# Patient Record
Sex: Male | Born: 1950 | Race: White | Hispanic: No | Marital: Married | Smoking: Former smoker
Health system: Southern US, Community
[De-identification: ages and names within clinical notes are randomized; demographics above are authoritative.]

## PROBLEM LIST (undated history)

## (undated) DIAGNOSIS — K219 Gastro-esophageal reflux disease without esophagitis: Secondary | ICD-10-CM

## (undated) DIAGNOSIS — R569 Unspecified convulsions: Secondary | ICD-10-CM

## (undated) DIAGNOSIS — H269 Unspecified cataract: Secondary | ICD-10-CM

## (undated) DIAGNOSIS — F419 Anxiety disorder, unspecified: Secondary | ICD-10-CM

## (undated) DIAGNOSIS — M1990 Unspecified osteoarthritis, unspecified site: Secondary | ICD-10-CM

## (undated) DIAGNOSIS — Z9889 Other specified postprocedural states: Secondary | ICD-10-CM

## (undated) DIAGNOSIS — D649 Anemia, unspecified: Secondary | ICD-10-CM

## (undated) DIAGNOSIS — R7303 Prediabetes: Secondary | ICD-10-CM

## (undated) DIAGNOSIS — Z8619 Personal history of other infectious and parasitic diseases: Secondary | ICD-10-CM

## (undated) DIAGNOSIS — N390 Urinary tract infection, site not specified: Secondary | ICD-10-CM

## (undated) DIAGNOSIS — T8859XA Other complications of anesthesia, initial encounter: Secondary | ICD-10-CM

## (undated) DIAGNOSIS — K5790 Diverticulosis of intestine, part unspecified, without perforation or abscess without bleeding: Secondary | ICD-10-CM

## (undated) DIAGNOSIS — H409 Unspecified glaucoma: Secondary | ICD-10-CM

## (undated) DIAGNOSIS — K2270 Barrett's esophagus without dysplasia: Secondary | ICD-10-CM

## (undated) DIAGNOSIS — G8929 Other chronic pain: Secondary | ICD-10-CM

## (undated) DIAGNOSIS — M1711 Unilateral primary osteoarthritis, right knee: Secondary | ICD-10-CM

## (undated) DIAGNOSIS — F329 Major depressive disorder, single episode, unspecified: Secondary | ICD-10-CM

## (undated) DIAGNOSIS — R51 Headache: Secondary | ICD-10-CM

## (undated) DIAGNOSIS — F32A Depression, unspecified: Secondary | ICD-10-CM

## (undated) DIAGNOSIS — E7800 Pure hypercholesterolemia, unspecified: Secondary | ICD-10-CM

## (undated) DIAGNOSIS — T7840XA Allergy, unspecified, initial encounter: Secondary | ICD-10-CM

## (undated) DIAGNOSIS — N3010 Interstitial cystitis (chronic) without hematuria: Secondary | ICD-10-CM

## (undated) DIAGNOSIS — K635 Polyp of colon: Secondary | ICD-10-CM

## (undated) DIAGNOSIS — R112 Nausea with vomiting, unspecified: Secondary | ICD-10-CM

## (undated) DIAGNOSIS — G4730 Sleep apnea, unspecified: Secondary | ICD-10-CM

## (undated) DIAGNOSIS — T4145XA Adverse effect of unspecified anesthetic, initial encounter: Secondary | ICD-10-CM

## (undated) DIAGNOSIS — I10 Essential (primary) hypertension: Secondary | ICD-10-CM

## (undated) HISTORY — PX: CARDIAC CATHETERIZATION: SHX172

## (undated) HISTORY — DX: Diverticulosis of intestine, part unspecified, without perforation or abscess without bleeding: K57.90

## (undated) HISTORY — DX: Unspecified convulsions: R56.9

## (undated) HISTORY — DX: Essential (primary) hypertension: I10

## (undated) HISTORY — PX: APPENDECTOMY: SHX54

## (undated) HISTORY — DX: Headache: R51

## (undated) HISTORY — PX: EYE SURGERY: SHX253

## (undated) HISTORY — DX: Anemia, unspecified: D64.9

## (undated) HISTORY — DX: Polyp of colon: K63.5

## (undated) HISTORY — DX: Major depressive disorder, single episode, unspecified: F32.9

## (undated) HISTORY — PX: SINOSCOPY: SHX187

## (undated) HISTORY — DX: Barrett's esophagus without dysplasia: K22.70

## (undated) HISTORY — PX: HEMORROIDECTOMY: SUR656

## (undated) HISTORY — DX: Unspecified osteoarthritis, unspecified site: M19.90

## (undated) HISTORY — PX: OTHER SURGICAL HISTORY: SHX169

## (undated) HISTORY — DX: Depression, unspecified: F32.A

## (undated) HISTORY — DX: Allergy, unspecified, initial encounter: T78.40XA

## (undated) HISTORY — DX: Pure hypercholesterolemia, unspecified: E78.00

## (undated) HISTORY — PX: KNEE SURGERY: SHX244

## (undated) HISTORY — PX: DG KNEE LEFT COMPLETE (ARMC HX): HXRAD1556

## (undated) HISTORY — PX: TONSILLECTOMY: SUR1361

## (undated) HISTORY — DX: Unspecified cataract: H26.9

## (undated) HISTORY — DX: Other chronic pain: G89.29

## (undated) HISTORY — PX: ANKLE SURGERY: SHX546

## (undated) HISTORY — DX: Urinary tract infection, site not specified: N39.0

## (undated) HISTORY — DX: Interstitial cystitis (chronic) without hematuria: N30.10

## (undated) HISTORY — DX: Personal history of other infectious and parasitic diseases: Z86.19

## (undated) HISTORY — DX: Sleep apnea, unspecified: G47.30

## (undated) HISTORY — DX: Unspecified glaucoma: H40.9

## (undated) HISTORY — DX: Gastro-esophageal reflux disease without esophagitis: K21.9

## (undated) HISTORY — DX: Anxiety disorder, unspecified: F41.9

## (undated) HISTORY — PX: JOINT REPLACEMENT: SHX530

---

## 2004-01-27 ENCOUNTER — Ambulatory Visit (HOSPITAL_COMMUNITY): Admission: RE | Admit: 2004-01-27 | Discharge: 2004-01-27 | Payer: Self-pay | Admitting: Specialist

## 2005-04-25 ENCOUNTER — Encounter
Admission: RE | Admit: 2005-04-25 | Discharge: 2005-07-24 | Payer: Self-pay | Admitting: Physical Medicine & Rehabilitation

## 2005-04-25 ENCOUNTER — Ambulatory Visit: Payer: Self-pay | Admitting: Physical Medicine & Rehabilitation

## 2005-09-24 HISTORY — PX: OTHER SURGICAL HISTORY: SHX169

## 2005-10-24 ENCOUNTER — Encounter: Admission: RE | Admit: 2005-10-24 | Discharge: 2005-10-24 | Payer: Self-pay | Admitting: Orthopaedic Surgery

## 2006-01-10 ENCOUNTER — Inpatient Hospital Stay (HOSPITAL_COMMUNITY): Admission: RE | Admit: 2006-01-10 | Discharge: 2006-01-12 | Payer: Self-pay | Admitting: Orthopaedic Surgery

## 2006-09-24 HISTORY — PX: CERVICAL FUSION: SHX112

## 2006-10-15 ENCOUNTER — Encounter: Admission: RE | Admit: 2006-10-15 | Discharge: 2006-10-15 | Payer: Self-pay | Admitting: Orthopaedic Surgery

## 2007-02-13 ENCOUNTER — Ambulatory Visit (HOSPITAL_COMMUNITY): Admission: RE | Admit: 2007-02-13 | Discharge: 2007-02-14 | Payer: Self-pay | Admitting: Orthopaedic Surgery

## 2008-06-25 ENCOUNTER — Encounter: Payer: Self-pay | Admitting: Gastroenterology

## 2009-03-29 ENCOUNTER — Encounter: Admission: RE | Admit: 2009-03-29 | Discharge: 2009-03-29 | Payer: Self-pay | Admitting: Orthopaedic Surgery

## 2009-07-04 ENCOUNTER — Ambulatory Visit: Payer: Self-pay | Admitting: Gastroenterology

## 2009-09-13 ENCOUNTER — Telehealth: Payer: Self-pay | Admitting: Gastroenterology

## 2010-05-08 ENCOUNTER — Encounter: Payer: Self-pay | Admitting: Gastroenterology

## 2010-05-10 ENCOUNTER — Telehealth: Payer: Self-pay | Admitting: Gastroenterology

## 2010-07-10 ENCOUNTER — Encounter: Admission: RE | Admit: 2010-07-10 | Discharge: 2010-07-10 | Payer: Self-pay | Admitting: Orthopaedic Surgery

## 2010-10-01 ENCOUNTER — Encounter: Payer: Self-pay | Admitting: Gastroenterology

## 2010-10-02 ENCOUNTER — Telehealth: Payer: Self-pay | Admitting: Gastroenterology

## 2010-10-10 ENCOUNTER — Ambulatory Visit
Admission: RE | Admit: 2010-10-10 | Discharge: 2010-10-10 | Payer: Self-pay | Source: Home / Self Care | Attending: Gastroenterology | Admitting: Gastroenterology

## 2010-10-10 ENCOUNTER — Encounter: Payer: Self-pay | Admitting: Gastroenterology

## 2010-10-10 DIAGNOSIS — R197 Diarrhea, unspecified: Secondary | ICD-10-CM | POA: Insufficient documentation

## 2010-10-10 DIAGNOSIS — K219 Gastro-esophageal reflux disease without esophagitis: Secondary | ICD-10-CM | POA: Insufficient documentation

## 2010-10-24 NOTE — Procedures (Signed)
Summary: Recall Assessment/[facility]  Recall Assessment/ GI   Imported By: Sherian Rein 05/24/2010 11:19:13  _____________________________________________________________________  External Attachment:    Type:   Image     Comment:   External Document

## 2010-10-24 NOTE — Progress Notes (Signed)
Summary: ECL?  Phone Note Call from Patient Call back at [phone]   Caller: Patient Call For: Dr. Christella Hartigan Reason for Call: Talk to Nurse Summary of Call: EGD and COL recall dates are a few months apart... pt wants to know if he can have ECL Initial call taken by: Vallarie Mare,  May 10, 2010 2:01 PM  Follow-up for Phone Call        Dr Christella Hartigan the pt has recall for EGD Oct 2011 and Colon for Feb 2012.  Can he have these together in Feb. ?  I advised the pt Dr Christella Hartigan is out of the office until next week. Follow-up by: Chales Abrahams CMA Duncan Dull),  May 10, 2010 2:07 PM  Additional Follow-up for Phone Call Additional follow up Details #1::        that is ok to have ecl in february Additional Follow-up by: Rachael Fee MD,  May 11, 2010 5:11 PM    Additional Follow-up for Phone Call Additional follow up Details #2::    left message on machine to call back Chales Abrahams CMA Duncan Dull)  May 15, 2010 9:03 AM   pt returned call we will call him the end of Jan to schedule.  Flag in the system. Follow-up by: Chales Abrahams CMA Duncan Dull),  May 15, 2010 10:05 AM   Appended Document: ECL?    Clinical Lists Changes  Observations: Added new observation of EGD DUE: 10/2010 (05/15/2010 10:10) Added new observation of COLONNXTDUE: 10/2010 (05/15/2010 10:10)

## 2010-10-26 NOTE — Letter (Signed)
Summary: [name] Instructions  [name]  [address] [name], [place] [address]   Phone: [phone]  Fax: [phone]       Brad Fischer    Jun 30, 1951    MRN: [id number]        Procedure Day Dorna Bloom:Wednesday February 29th, 2012     Arrival Time: 2:30pm     Procedure Time: 3:30pm     Location of Procedure:                    _ x_  [name] (4th Floor)                        PREPARATION FOR COLONOSCOPY WITH MOVIPREP   Starting 5 days prior to your procedure2/24/12 do not eat nuts, seeds, popcorn, corn, beans, peas,  salads, or any raw vegetables.  Do not take any fiber supplements (e.g. Metamucil, Citrucel, and Benefiber).  THE DAY BEFORE YOUR PROCEDURE         DATE: 11/21/10  DAY: Tuesday  1.  Drink clear liquids the entire day-NO SOLID FOOD  2.  Do not drink anything colored red or purple.  Avoid juices with pulp.  No orange juice.  3.  Drink at least 64 oz. (8 glasses) of fluid/clear liquids during the day to prevent dehydration and help the prep work efficiently.  CLEAR LIQUIDS INCLUDE: Water Jello Ice Popsicles Tea (sugar ok, no milk/cream) Powdered fruit flavored drinks Coffee (sugar ok, no milk/cream) Gatorade Juice: apple, white grape, white cranberry  Lemonade Clear bullion, consomm, broth Carbonated beverages (any kind) Strained chicken noodle soup Hard Candy                             4.  In the morning, mix first dose of MoviPrep solution:    Empty 1 Pouch A and 1 Pouch B into the disposable container    Add lukewarm drinking water to the top line of the container. Mix to dissolve    Refrigerate (mixed solution should be used within 24 hrs)  5.  Begin drinking the prep at 5:00 p.m. The MoviPrep container is divided by 4 marks.   Every 15 minutes drink the solution down to the next mark (approximately 8 oz) until the full liter is complete.   6.  Follow completed prep with 16 oz of clear liquid of your choice  (Nothing red or purple).  Continue to drink clear liquids until bedtime.  7.  Before going to bed, mix second dose of MoviPrep solution:    Empty 1 Pouch A and 1 Pouch B into the disposable container    Add lukewarm drinking water to the top line of the container. Mix to dissolve    Refrigerate  THE DAY OF YOUR PROCEDURE      DATE: 11/22/10 DAY: Wednesday  Beginning at 10:30 a.m. (5 hours before procedure):         1. Every 15 minutes, drink the solution down to the next mark (approx 8 oz) until the full liter is complete.  2. Follow completed prep with 16 oz. of clear liquid of your choice.    3. You may drink clear liquids until 1:30pm (2 HOURS BEFORE PROCEDURE).   MEDICATION INSTRUCTIONS  Unless otherwise instructed, you should take regular prescription medications with a small sip of water   as early as possible the morning of your procedure.  OTHER INSTRUCTIONS  You will need a responsible adult at least 59 years of age to accompany you and drive you home.   This person must remain in the waiting room during your procedure.  Wear loose fitting clothing that is easily removed.  Leave jewelry and other valuables at home.  However, you may wish to bring a book to read or  an iPod/MP3 player to listen to music as you wait for your procedure to start.  Remove all body piercing jewelry and leave at home.  Total time from sign-in until discharge is approximately 2-3 hours.  You should go home directly after your procedure and rest.  You can resume normal activities the  day after your procedure.  The day of your procedure you should not:   Drive   Make legal decisions   Operate machinery   Drink alcohol   Return to work  You will receive specific instructions about eating, activities and medications before you leave.    The above instructions have been reviewed and explained to me by   Estill Bamberg.     I fully understand and can verbalize these  instructions _____________________________ Date _________

## 2010-10-26 NOTE — Assessment & Plan Note (Signed)
Review of gastrointestinal problems: 1. Barrett's esophagus: EGD reports from 2009, 2008, 2007 and several predating that. The 2007 and 2008 EGDs document irregular Z line, pathology proved Barrett's mucosa without dysplasia (Dr. Charmayne Sheer) The 2009 EGD also documents irregular Z line however biopsies did not show Barrett's change. 2. Colon polyps: He had a colonoscopy in 2007 and this showed diverticulosis without polyps. Pt is very clear that a previous colonoscopy in 1990s showed precancerous polyps    History of Present Illness Visit Type: Follow-up Visit Primary GI MD: Rob Bunting MD Primary Provider: Lise Auer, MD  Requesting Provider: na Chief Complaint: Hosp f/u for diarrhea  History of Present Illness:     very pleasant 60 year old man whom I last saw several months ago.  who had some intermittent diarrhrea 1-2 weeks ago, this led up to a more signficant cramping, diarrheal illness.  No recent antibiotics.   He 20 loose stools in 1-2 days.  No nausea or vomiting or fevers. Went to local ER, in [place].    he sent in stool specimin, testing was neg for c . diff, routine cultures.  he took 3 days of cipro.  Feels 1000 times better.  No sick contacts.            Current Medications (verified): 1)  Hydrocodone-Acetaminophen 5-500 Mg Tabs (Hydrocodone-Acetaminophen) .... As Needed 2)  Budeprion Xl 150 Mg Xr24h-Tab (Bupropion Hcl) .Marland Kitchen.. 1 By Mouth Once Daily 3)  Atenolol 50 Mg Tabs (Atenolol) .... 1/2 Tablet By Mouth Once Daily 4)  Alprazolam 0.5 Mg Tabs (Alprazolam) .... As Needed 5)  Vitamin D3 1000 Unit Caps (Cholecalciferol) .... 2,000 International Units At 4am 6)  Vitamin B-12 5000 Mcg Subl (Cyanocobalamin) .Marland Kitchen.. 1 Every Am and 1 Every Pm 7)  Iodine 225 .... One Tablet By Mouth Three Times A Day 8)  Metamucil 30.9 % Powd (Psyllium) .... Uses Daily 9)  Co Q-10 30 Mg Caps (Coenzyme Q10) .Marland Kitchen.. 1 By Mouth Two Times A Day 10)  Tamsulosin Hcl 0.4 Mg Caps (Tamsulosin  Hcl) .... One Capsule By Mouth Once Daily 11)  Finasteride 5 Mg Tabs (Finasteride) .... One Tablet By Mouth Once Daily 12)  Aspirin 81 Mg  Tabs (Aspirin) .... One Tablet By Mouth Once Daily 13)  Ranitidine Hcl 75 Mg Tabs (Ranitidine Hcl) .... One Tablet By Mouth Once Daily 14)  Saw Palmetto 160 Mg Caps (Saw Palmetto (Serenoa Repens)) .... One Capsule By Mouth Two Times A Day 15)  Magnesium Glycinate 133mg  .... One Tablet By Mouth Once Daily 16)  Vitamin B-12 5000 Mcg Tablet (Cyanocobalamin) .... One Tablet By Mouth Two Times A Day 17)  Vitamin D3 10000 Unit Caps (Cholecalciferol) .... One Capsule By Mouth Once Daily 18)  Protein  Powd (Protein) .... As Directed 19)  Multivitamin & Mineral  Liqd (Multiple Vitamins-Minerals) .... As Directed  Allergies (verified): 1)  ! Bactrim 2)  ! Codeine  Vital Signs:  Patient profile:   60 year old male Height:      68 inches Weight:      176 pounds BMI:     26.86 BSA:     1.94 Pulse rate:   88 / minute Pulse rhythm:   regular BP sitting:   124 / 68  (left arm) Cuff size:   regular  Vitals Entered By: Ok Anis CMA (October 10, 2010 3:01 PM)  Physical Exam  Additional Exam:  Constitutional: generally well appearing Psychiatric: alert and oriented times 3 Abdomen: soft, non-tender, non-distended,  normal bowel sounds    Impression & Recommendations:  Problem # 1:  History of colon polyps, recent diarrheal illness I suspect his recent diarrheal illness was infectious. Likely a virus. He has completely recovered. He was due for colon polyp surveillance examinations and we will schedule that at his soonest convenience.  Problem # 2:  GERD, history of Barrett's change he is due for surveillance EGD and we will set that up at the same time as his colonoscopy. I recommended he restart proton pump inhibitors since H2 blockers did not seem to be helping his mild indigestion symptoms.  Patient Instructions: 1)  You will be scheduled to have an  upper endoscopy. 2)  You will be scheduled to have a colonoscopy. 3)  Stop the ranitidine, start OTC prilosec or prevacid (or the generic equivalent).  One pill once daily. 4)  A copy of this information will be sent to Dr. Welton Flakes. 5)  The medication list was reviewed and reconciled.  All changed / newly prescribed medications were explained.  A complete medication list was provided to the patient / caregiver.  Appended Document: Orders Update    Clinical Lists Changes  Problems: Added new problem of DIARRHEA (ICD-787.91) Added new problem of GERD (ICD-530.81) Medications: Added new medication of MOVIPREP 100 GM  SOLR (PEG-KCL-NACL-NASULF-NA ASC-C) As per prep instructions. - Signed Rx of MOVIPREP 100 GM  SOLR (PEG-KCL-NACL-NASULF-NA ASC-C) As per prep instructions.;  #1 x 0;  Signed;  Entered by: Christie Nottingham CMA (AAMA);  Authorized by: Rachael Fee MD;  Method used: Electronically to [facility] [address], [place], [place], [place]  [address], Ph: [phone] or [phone], Fax: [phone] Orders: Added new Test order of Colon/Endo (Colon/Endo) - Signed    Prescriptions: MOVIPREP 100 GM  SOLR (PEG-KCL-NACL-NASULF-NA ASC-C) As per prep instructions.  #1 x 0   Entered by:   Christie Nottingham CMA (AAMA)   Authorized by:   Rachael Fee MD   Signed by:   Christie Nottingham CMA (AAMA) on 10/10/2010   Method used:   Electronically to        [facility] (retail)       [address]       [place], [place]  [address]       Ph: [phone] or [phone]       Fax: [phone]   RxID:   [id number]

## 2010-10-26 NOTE — Progress Notes (Signed)
Summary: triage  Phone Note Call from Patient Call back at Home Phone [phone]   Caller: Vilinda Flake Call For: Dr Christella Hartigan Reason for Call: Talk to Nurse Summary of Call: Patient has severe diarrhea x 10 days and was taken to the emergency room yesterday and was told to f.u with his gi doc, but patient can't wait until first available appt 2-8 Initial call taken by: Tawni Levy,  October 02, 2010 1:16 PM  Follow-up for Phone Call        pt scheduled to see Dr Christella Hartigan on 10/10/10.  Pt aware Follow-up by: Chales Abrahams CMA (AAMA),  October 02, 2010 1:28 PM

## 2010-11-22 ENCOUNTER — Other Ambulatory Visit: Payer: Self-pay | Admitting: Gastroenterology

## 2010-11-22 ENCOUNTER — Encounter (AMBULATORY_SURGERY_CENTER): Payer: Medicare Other | Admitting: Gastroenterology

## 2010-11-22 DIAGNOSIS — Z1211 Encounter for screening for malignant neoplasm of colon: Secondary | ICD-10-CM

## 2010-11-22 DIAGNOSIS — K5730 Diverticulosis of large intestine without perforation or abscess without bleeding: Secondary | ICD-10-CM

## 2010-11-22 DIAGNOSIS — K2270 Barrett's esophagus without dysplasia: Secondary | ICD-10-CM

## 2010-11-22 DIAGNOSIS — Z86010 Personal history of colonic polyps: Secondary | ICD-10-CM

## 2010-11-28 ENCOUNTER — Encounter: Payer: Self-pay | Admitting: Gastroenterology

## 2010-11-30 NOTE — Procedures (Signed)
Summary: Colonoscopy  Patient: [name] Note: All result statuses are Final unless otherwise noted.  Tests: (1) Colonoscopy (COL)   COL Colonoscopy           DONE     [facility]     [address]     [place], [place]  [address]           COLONOSCOPY PROCEDURE REPORT           PATIENT:  Brad Fischer, Brad Fischer  MR#:  [id number]     BIRTHDATE:  [birth year], 59 yrs. old  GENDER:  male     ENDOSCOPIST:  Rachael Fee, MD     PROCEDURE DATE:  11/22/2010     PROCEDURE:  Diagnostic Colonoscopy     ASA CLASS:  Class II     INDICATIONS:  history of pre-cancerous (adenomatous) colon polyps,     Dr. Virginia Rochester     MEDICATIONS:   Fentanyl 100 mcg IV, Versed 10 mg IV, Benadryl 25     mg IV           DESCRIPTION OF PROCEDURE:   After the risks benefits and     alternatives of the procedure were thoroughly explained, informed     consent was obtained.  Digital rectal exam was performed and     revealed no rectal masses.   The LB [id number]-H180AL [id number] endoscope     was introduced through the anus and advanced to the cecum, which     was identified by both the appendix and ileocecal valve, without     limitations.  The quality of the prep was good, using MoviPrep.     The instrument was then slowly withdrawn as the colon was fully     examined.     <<PROCEDUREIMAGES>>     FINDINGS:  Moderate diverticulosis was found in the sigmoid to     descending colon segments (see image4).  Internal and external     Hemorrhoids were found.  This was otherwise a normal examination     of the colon (see image2, image3, and image6).   Retroflexed views     in the rectum revealed no abnormalities.    The scope was then     withdrawn from the patient and the procedure completed.     COMPLICATIONS:  None           ENDOSCOPIC IMPRESSION:     1) Moderate diverticulosis in the sigmoid to descending colon     segments     2) Internal and external hemorrhoids     3) Otherwise normal examination  RECOMMENDATIONS:     1) Given your personal history of adenomatous (pre-cancerous)     polyps, you will need a repeat colonoscopy in 5 years.           REPEAT EXAM:  5 years           ______________________________     Rachael Fee, MD           cc: Amada Kingfisher, MD           n.     Rosalie Doctor:   Rachael Fee at 11/22/2010 03:26 PM           [name], [id number]  Note: An exclamation mark (!) indicates a result that was not dispersed into the flowsheet. Document Creation Date: 11/22/2010 3:26 PM _______________________________________________________________________  (1) Order result status: Final Collection or observation date-time: 11/22/2010 15:21 Requested date-time:  Receipt date-time:  Reported date-time:  Referring Physician:   Ordering Physician: Rob Bunting [phone]) Specimen Source:  Source: Launa Grill Order Number: [id number] Lab site:   Appended Document: Colonoscopy    Clinical Lists Changes  Observations: Added new observation of COLONNXTDUE: 10/2015 (11/22/2010 15:55)

## 2010-11-30 NOTE — Procedures (Addendum)
Summary: Upper Endoscopy  Patient: [name] Note: All result statuses are Final unless otherwise noted.  Tests: (1) Upper Endoscopy (EGD)   EGD Upper Endoscopy       DONE     [facility]     [address]     [place], [place]  [address]          ENDOSCOPY PROCEDURE REPORT          PATIENT:  Brad, Fischer  MR#:  [id number]     BIRTHDATE:  20-Jan-1951, 59 yrs. old  GENDER:  male     ENDOSCOPIST:  Rachael Fee, MD     PROCEDURE DATE:  11/22/2010     PROCEDURE:  EGD with biopsy, 432[address]     ASA CLASS:  Class II     INDICATIONS:  h/o Barrett's Esophagus          MEDICATIONS:  Versed 3 mg IV     TOPICAL ANESTHETIC:  Exactacain Spray          DESCRIPTION OF PROCEDURE:   After the risks benefits and     alternatives of the procedure were thoroughly explained, informed     consent was obtained.  The [facility] GIF-H180 [id number] endoscope was     introduced through the mouth and advanced to the second portion of     the duodenum, without limitations.  The instrument was slowly     withdrawn as the mucosa was fully examined.     <<PROCEDUREIMAGES>>     There was mild, non-specific gastritis (see image2).  A hiatal     hernia was found. This was 2cm (see image1).  There was an     irregular Z-line (see image5), this was biopsied and sent to     pathology (jar 1).  Otherwise the examination was normal (see     image3 and image4).    Retroflexed views revealed no     abnormalities.    The scope was then withdrawn from the patient     and the procedure completed.     COMPLICATIONS:  None          ENDOSCOPIC IMPRESSION:     1) Mild gastritis     2) Hiatal hernia     3) Irregular Z-line, previously shown to have Barrett's change     without dysplasia.  Repeat biopsies taken today.     4) Otherwise normal examination          RECOMMENDATIONS:     Await final biopsies.     Stay on PPI once daily.          ______________________________     Rachael Fee, MD         n.     eSIGNED:   Rachael Fee at 11/22/2010 03:49 PM          [name], [id number]  Note: An exclamation mark (!) indicates a result that was not dispersed into the flowsheet. Document Creation Date: 11/22/2010 3:49 PM _______________________________________________________________________  (1) Order result status: Final Collection or observation date-time: 11/22/2010 15:32 Requested date-time:  Receipt date-time:  Reported date-time:  Referring Physician:   Ordering Physician: Rob Bunting [phone]) Specimen Source:  Source: Launa Grill Order Number: [id number] Lab site:   Appended Document: Upper Endoscopy     Procedures Next Due Date:    EGD: 10/2013

## 2010-12-05 NOTE — Letter (Addendum)
Summary: Results Letter  [facility]  [address] [place], [place] [address]   Phone: [phone]  Fax: [phone]        November 28, 2010 MRN: [id number]    Brad Fischer [address] [place], [place]  [address]    Dear [name],   The biopsies during your recent procedure showed Barrett's mucosa, but NO sign of the pre-cancerous change called "dysplasia."   Therefore, unless new symptoms arise you will not need another upper endoscopy for 3 years.  We will therefore put your information in our reminder system and will contact you in 3 years to schedule a repeat procedure.  Please call with any questions or concerns.       Sincerely,  Rachael Fee MD  This letter has been electronically signed by your physician.  Appended Document: Results Letter letter mailed

## 2011-02-06 NOTE — Op Note (Signed)
NAME:  Brad Fischer, Brad Fischer NO.:  [id number]   MEDICAL RECORD NO.:  [id number]          PATIENT TYPE:  INP   LOCATION:  5020                         FACILITY:  [facility]   PHYSICIAN:  Sharolyn Douglas, M.D.        DATE OF BIRTH:  1951-01-23   DATE OF PROCEDURE:  02/13/2007  DATE OF DISCHARGE:                               OPERATIVE REPORT   DIAGNOSIS:  Cervical spondylotic radiculopathy.   PROCEDURES:  1. Anterior cervical diskectomy, C3-4 and C4-5.  2. Anterior cervical arthrodesis, C3-4 and C4-5, with placement of two      PEEK cages, one at each level.  3. Anterior cervical plating, C3 through C5, using the Abbott spine      system.  4. Left anterior iliac crest bone graft.   SURGEON:  Sharolyn Douglas, MD.   ASSISTANT:  Aura Fey. Bobbe Medico.   ANESTHESIA:  General endotracheal.   COMPLICATIONS:  None.   Needle and sponge count correct.   INDICATIONS:  The patient is a pleasant 60 year old male with persistent  neck and left upper extremity pain.  His imaging studies show  degenerative changes most advanced C3-4, C4-5.  MRI scan shows foraminal  narrowing at both levels.  I had originally discussed with him  performing an ACDF at C4-5.  After careful review of his MRI scan again  preoperatively, it was evident that he had significant foraminal  narrowing at C3-4 as well, worse on the left side, and because most of  his symptomatology was in the distribution of the C3-4, C4-5 levels, I  elected to perform the surgery from C3 to C5.  Risks, benefits and  alternatives were reviewed with the patient before going to the  operating room.   PROCEDURE:  After informed consent he was taken to the OR.  He was given  prophylactic IV antibiotics.  He underwent general endotracheal  anesthesia without difficulty.  He was positioned carefully with the  [place] head rest.  The neck was placed inside extension.  Five pounds  of halter traction applied.  The neck was prepped and draped  in the  usual sterile fashion including the left anterior iliac crest.  We then  made a small a left-sided incision at the level of the thyroid  cartilage.  Dissection was carried through this transverse incision and  through the platysma.  The interval then developed between the SCM and  strap muscles medially down to the prevertebral space.  A blunt  dissection was completed to expose the anterior cervical spine.  The  esophagus, trachea and carotid sheath were identified and protected at  all times.  A spinal needle was placed at C5-6 and intraoperative x-ray  was taken to confirm this level.  We then worked up exposing the C3-4  and C4-5 levels.  A deep shadow line retractor was placed.  Caspar  distraction pins placed in the C3, C4, C5 vertebral bodies.  Gentle  distraction was applied.  The microscope was brought into the field.  The remainder of the operation was done under microscopic illumination  and magnification.  Starting at C3-4, a radical diskectomy carried back  to the posterior longitudinal ligament.  A high-speed bur used to take  down the uncovertebral joints, posterior vertebral margins.  We then  completed wide foraminotomies bilaterally.  The cartilaginous endplates  were scraped clean in preparation for the arthrodesis.   We turned our attention to obtaining anterior iliac crest bone graft  from the left hip.  A small 1 cm incision was made of the left ASIS.  Dissection was carried sharply through the deep fascia.  A trephine was  used to obtain several bone cores.  This wound was then irrigated.  Hemostasis was achieved.  It was closed in layers using 2-0 Vicryl and 3-  0 Vicryl.  We then used Dermabond on the skin edges.   We turned our attention back to the cervical spine.  A 5-mm PEEK cage  was packed tightly with the anterior iliac crest bone graft.  This cage  was then inserted into the C3-4 disk space and countersunk 1 mm.  We  then performed a similar  procedure at C4-5 again with a radical  diskectomy.  We scraped the cartilaginous endplates.  We used a high-  speed bur to take down the uncovertebral joints.  A Kerrison punch was  used to complete foraminotomies bilaterally.  At this level we also used  a 5-mm PEEK cage which was packed with the bone graft obtained the left  iliac crest.  The PEEK cage was countersunk 1 mm.  We then placed  anterior cervical plate from C3 to C5 with six 12-mm locking screws.  We  ensured the locking mechanism engaged.  The screw purchase was adequate.  The wound was irrigated.  Hemostasis was achieved.  The esophagus,  trachea, and carotid sheath were examined.  There were no apparent  injuries.  A deep TLS drain was left in place.  Platysma closed with  interrupted 2-0 Vicryl suture.  Subcutaneous layer closed with 2-0  Vicryl and 3-0 Vicryl, followed by a running 4-0 subcuticular suture on  the skin edges.  Dermabond was applied.  A soft cervical collar placed.  The patient was extubated without difficulty, transferred to recovery in  stable condition neurologically intact.   It should be noted that my assistant, Aura Fey. Bobbe Medico., was present  throughout the procedure.  She assisted with positioning.  She assisted  with the exposure, using the suction and helping with the retractors.  She then worked under the microscope in the using the suction and  protecting the soft tissues.  She helped using the drill while I held  the drill guide to place the cervical plate.  Finally, she helped with  the iliac crest bone graft by providing traction, and she helped with  wound closure.      Sharolyn Douglas, M.D.  Electronically Signed     MC/MEDQ  D:  02/13/2007  T:  02/14/2007  Job:  [id number]

## 2011-02-09 NOTE — H&P (Signed)
NAME:  Brad Fischer, Brad Fischer NO.:  [id number]   MEDICAL RECORD NO.:  [id number]          PATIENT TYPE:  INP   LOCATION:  NA                           FACILITY:  [facility]   PHYSICIAN:  Sharolyn Douglas, M.D.        DATE OF BIRTH:  1951/05/29   DATE OF ADMISSION:  DATE OF DISCHARGE:                                HISTORY & PHYSICAL   DATE OF ADMISSION:  January 10, 2006   CHIEF COMPLAINT:  Low back pain and bilateral lower extremity pain, left  greater than right.   HISTORY OF PRESENT ILLNESS:  The patient is a 60 year old male who has had a  longstanding history of problems with his back radiating into his lower  extremities.  He has tried numerous types of conservative management, all  unfortunately without any lasting relief of his symptoms.  It was thought  his best course of management at this point would be a posterior spinal  fusion procedure and decompression.  Risks and benefits of this procedure  were discussed with the patient by Dr. Noel Gerold as well as myself.  He  indicated understanding and opted to proceed.   ALLERGIES:  BACTRIM and DARVON cause headaches.   MEDICATIONS:  1.  Relpax 40 mg p.o. p.r.n. migraines.  2.  Hydrocodone 10/325 one to two p.o. daily p.r.n. pain.  3.  Mirapex 0.125 mg p.r.n. for restless leg syndrome.  4.  Simvastatin 40 mg p.o. daily which he stopped 3 days ago.  5.  Nexium 40 mg p.o. daily which he stopped 3 days ago.  6.  Trazodone 100 mg p.o. q.h.s. p.r.n.  7.  Atenolol 50 mg p.o. b.i.d.  8.  Paroxetine HCl 20 mg p.o. daily.   PAST MEDICAL HISTORY:  1.  Migraines.  2.  Anxiety.  3.  Hypertension.  4.  History of ulcers.  5.  BPH..  6.  Constipation.  7.  Degenerative disc disease.   PAST SURGICAL HISTORY:  None on his spine.   SOCIAL HISTORY:  The patient is married.  He quit smoking 7 years ago. He  denies alcohol use.  His wife will be available to help him through his  postoperative course.   FAMILY HISTORY:   Noncontributory.   REVIEW OF SYSTEMS:  GENERAL:  The patient denies any fevers, chills, sweats,  bleeding tendencies.  CNS:  Denies blurred vision, double vision, seizures,  headache, paralysis.  CARDIOVASCULAR:  Denies chest pain, angina, orthopnea,  claudication, or palpitations.  PULMONARY:  Denies shortness of breath,  productive cough, or hemoptysis.  GI:  Denies nausea, vomiting,  constipation, diarrhea, melena, or bloody stool.  GU:  Denies dysuria,  hematuria, or discharge.  MUSCULOSKELETAL:  As per HPI.   PHYSICAL EXAMINATION:  VITAL SIGNS:  Blood pressure is 126/72, respirations  are 16 and unlabored, pulse is 60 and regular.  GENERAL APPEARANCE:  The patient is a 60 year old white male who is alert  and oriented in no acute distress who is well-nourished, well-groomed,  appears his age, pleasant and cooperative to exam.  HEENT:  Head is normocephalic, atraumatic.  Pupils are equal, round,  reactive to light and accommodation.  NECK:  Soft to palpation.  No lymphadenopathy, thyromegaly, or bruits  appreciated.  CHEST:  Clear to auscultation bilaterally.  No rales, rhonchi, stridor,  wheezes, or friction rubs.  BREASTS:  Not pertinent, not performed.  HEART:  Regular rate and rhythm.  No murmurs, gallops, thrills, or rubs  noted.  S1, S2.  ABDOMEN:  Soft to palpation.  Nontender, nondistended.  No organomegaly  noted.  Positive bowel sounds throughout.  GENITOURINARY:  Not pertinent, not performed.  EXTREMITIES:  Per HPI.  SKIN:  Intact without any lesions or rashes.   X-ray shows degenerative changes at L4-5.  Discogram showed concordant pain  at this level with negative controls.   IMPRESSION:  1.  L4-5 degenerative disc disease and discogenic pain.  2.  Hypertension.  3.  Migraines.  4.  History of ulcer.  5.  Benign prostatic hypertrophy.   PLAN:  1.  Admit to [facility] on January 10, 2006, for an L4-5 posterior      spinal fusion.  This will be done by  Dr. Sharolyn Douglas.  2.  We are awaiting his preoperative clearance from his primary care doctor,      Dr. Welton Flakes.  Apparently, he is having a stress test done tomorrow.  Dr.      Welton Flakes will forward [place] the results and let us know if he can proceed with      his surgery on Thursday.      Verlin Fester, P.A.      Sharolyn Douglas, M.D.  Electronically Signed    CM/MEDQ  D:  01/07/2006  T:  01/07/2006  Job:  [id number]

## 2011-02-09 NOTE — Procedures (Signed)
NAME:  Brad Fischer, Brad Fischer NO.:  [id number]   MEDICAL RECORD NO.:  [id number]          PATIENT TYPE:  REC   LOCATION:  TPC                          FACILITY:  [facility]   PHYSICIAN:  Erick Colace, M.D.DATE OF BIRTH:  [birth year]   DATE OF PROCEDURE:  05/14/2005  DATE OF DISCHARGE:                                 OPERATIVE REPORT   PROCEDURE:  Acupuncture treatment #3 out of four scheduled.   Previous treatment done approximately one week ago.  Pain averaging 4/10.  He has both pain in hip, left knee, left upper trap, and pain in bilateral  feet.  He states that the last acupuncture treatment gave some relief with  foot pain x3 days.   He is ambulating 10-15 minutes, climbs steps and driving.   INTERVAL HISTORY:  MRI of hips demonstrated AVN.   He is following up with orthopedics regarding this.   Treatment today consisted of bilateral LV3, bilateral SP6, bilateral LV8,  bilateral TH8, bilateral GB34, as well as GB24.5 and DU20.   Electrical stimulation between DU20 and GB24.5, as well as between LV8 and  SP6 bilaterally, 4 hertz x 20 minutes.  The patient tolerated the procedure  well, has one more appointment for acupuncture treatment and then will  decide whether to continue treatments based on improvement.      Erick Colace, M.D.  Electronically Signed     AEK/MEDQ  D:  05/14/2005 15:26:55  T:  05/15/2005 16:10:96  Job:  [id number]

## 2011-02-09 NOTE — Procedures (Signed)
NAME:  Brad Fischer, Brad Fischer NO.:  [id number]   MEDICAL RECORD NO.:  [id number]          PATIENT TYPE:  REC   LOCATION:  TPC                          FACILITY:  [facility]   PHYSICIAN:  Erick Colace, M.D.DATE OF BIRTH:  Jul 11, 1951   DATE OF PROCEDURE:  05/10/2005  DATE OF DISCHARGE:                                 OPERATIVE REPORT   MEDICAL RECORD NUMBER:  [id number].   TREATMENT:  Acupuncture treatment #2. The patient states that his back pain  was better with the first treatment, i.e. Mingmen. However, he had some  increased knee pain relief using BL40.   Treatment today, overall pain level 5/10.   Treatment Mingmen technique was used with bilateral BL23, bilateral BL52,  and DU4. In addition, left BL40 and the four eyes of the knee, i.e. ST34,  ST 35, and LE4, LE3 were used. The patient tolerated the procedure well.  Treatment time 45 minutes. Electrical stimulation was used at 2 hertz.  Return in four days.      Erick Colace, M.D.  Electronically Signed     AEK/MEDQ  D:  05/10/2005 13:20:56  T:  05/10/2005 14:55:46  Job:  [id number]

## 2011-02-09 NOTE — Procedures (Signed)
NAME:  TESLA, BOCHICCHIO NO.:  [id number]   MEDICAL RECORD NO.:  [id number]          PATIENT TYPE:  REC   LOCATION:  TPC                          FACILITY:  [facility]   PHYSICIAN:  Erick Colace, M.D.DATE OF BIRTH:  Aug 28, 1951   DATE OF PROCEDURE:  05/07/2005  DATE OF DISCHARGE:                                 OPERATIVE REPORT   Acupuncture treatment #1 of four scheduled.  Response to previous trial  treatment, three to four-day relief of back pain with mingmen treatment.   The patient was placed prone.  Needles placed at bilateral KI3, bilateral  KI7 and bilateral BL40 as well as bilateral BL24, BL25, with electrical  stimulation between KI3, KI7 as well as in between bilateral BL24 and BL25  at 2 hertz stimulation x25 minutes.  The patient tolerated the procedure  well.  Postprocedure instructions given, and he is to return later this  week.      Erick Colace, M.D.  Electronically Signed     AEK/MEDQ  D:  05/07/2005 16:34:03  T:  05/08/2005 06:21:55  Job:  [id number]

## 2011-02-09 NOTE — Group Therapy Note (Signed)
ACUPUNCTURE CONSULTATION   REASON FOR CONSULTATION:  Question acupuncture for neck, back, and knee  pain.   HISTORY:  The patient is a 60 year old male who has had multiple pain  complaints involving neck, low back, and left knee.  His neck pain is of  four to five years' duration.  It is progressive in nature.  He states that  he has had imaging studies demonstrating three bulging disks in his neck and  that he wishes to avoid surgery for this.  He at times has left shoulder  pain that accompanies this and describes the pain as sharp and aching at  times.  In addition, he has had low back pain since the year 2000 when he  was splitting firewood.  His back wasout for 10 to 12 weeks and since that  time it has been in and out more than two times a year where he would have  exacerbations of pain.  More recently over the last year or two, he has had  accompanying left lower extremity pain mainly lateral and going down into  the left ankle area, but not into the foot.  In addition, he has had left  knee pain and has had multiple arthroscopic surgeries on his left knee. He  has been told that he has no cartilage left in his left knee.   He similarly wishes to avoid total knee replacement surgery.   PAST MEDICAL HISTORY:  1.  High blood pressure.  2.  Hiatal hernia.  3.  Hemorrhoids.   PAST SURGICAL HISTORY:  1.  Right ankle surgery.  2.  Left knee surgery in 1986, 1987, 1990, and 1992.  3.  Hemorrhoidectomy in 1998.   SOCIAL HISTORY:  Married.  Lives with his wife, Arline Asp.  Smokes half a pack a  day.   FAMILY HISTORY:  Heart disease, lung disease, high blood pressure,  psychiatric problems, alcohol abuse, and disability.   REVIEW OF SYSTEMS:  Positive for depression.  Has been taking Paxil for a  number of years.  Also with anxiety.  Numbness and tingling in his left foot  and weakness in his left knee.  He has some bladder urgency.   PHYSICAL EXAMINATION:  VITAL SIGNS:  Blood  pressure 103/76, pulse 46,  respirations 16, O2 saturation 97% on room air.  GENERAL:  Current pain level is 8/10 currently, but averaging 6.  NECK:  Some tenderness to palpation of the left upper trapezius.  He has no  evidence of shoulder girdle wasting.  EXTREMITIES:  He has full strength in bilateral upper and lower extremities.  Normal sensation in bilateral upper and lower extremities.  Normal range of  motion in bilateral upper and lower extremities with the exception of left  knee which lacks approximately 30 degrees of end-stage flexion.  He does  have some lateral knee pain with flexion. He does have crepitus in the left  knee greater than right knee.  He has pain with Apley test on the left knee.  BACK:  He has approximately 75% forward flexion and extension.  He has no  tenderness to palpation of the lumbar spine.  NEUROLOGIC:  Deep tendon reflexes are normal in bilateral upper and lower  extremities.   IMPRESSION:  1.  Chronic low back pain secondary to lumbar degenerative disk disease.  2.  Chronic neck pain secondary to cervical degenerative disk disease.  3.  Left knee osteoarthritis.   PLAN:  I have discussed with him that he has  multiple areas and it would be  difficult to treat each area every visit.  Instead, would like to focus on  certain areas.  The patient states his back pain is bothering him the most  at this point, but in addition has some concerns regarding left knee.  For  our treatment we will focus on the low back.  I did indicate that there are  some research studies showing decreased surgical rates for knee  osteoarthritis given a certain acupuncture protocol and he will conserve  this on subsequent visits.   ADDENDUM  Treatment today consisted of bilateral BL23, bilateral BL52, and DU4 with  electrical stimulation between the BL23, BL52 on each side at 2 hertz x20  minutes.  The patient tolerated the procedure well.  Post procedure  instructions  given.  We will schedule for acupuncture treatments weekly x4.  May need to get him in the right lateral decubitus position to treat the low  back as well as knee.      Erick Colace, M.D.  Electronically Signed     AEK/MedQ  D:  04/27/2005 10:32:43  T:  04/27/2005 12:04:32  Job #:  [id number]   cc:   Caralyn Guile. Ethelene Hal, M.D.  [address]  [place]  [place] [address]  Fax: [phone]

## 2011-02-09 NOTE — Procedures (Signed)
NAME:  Brad Fischer, Brad Fischer NO.:  [id number]   MEDICAL RECORD NO.:  [id number]          PATIENT TYPE:  REC   LOCATION:  TPC                          FACILITY:  [facility]   PHYSICIAN:  Erick Colace, M.D.DATE OF BIRTH:  1950-12-30   DATE OF PROCEDURE:  05/17/2005  DATE OF DISCHARGE:                                 OPERATIVE REPORT   MEDICAL RECORD NUMBER:  [id number].   His average pain now is 4/10, and his pain interference scores are similarly  4/10. Pretreatment scores were 6 to 8/10 with pain interference between 7 to  9/10. He has had pain in the knee and low back area.   Treatment today consisted of bilateral BL23, bilateral BL52, and DU4,  combined with bilateral BL25. Electrical stimulation between crossed needles  was 23, 52, and BL25. In addition, points ST34, ST10, ST9 and GB34 were  treated on the left with electrical stimulation between the points. Four  hertz for 25 minutes. The patient tolerated the procedure well. Post  procedure instructions given.      Erick Colace, M.D.  Electronically Signed     AEK/MEDQ  D:  05/17/2005 17:11:55  T:  05/18/2005 12:06:38  Job:  [id number]   cc:   Caralyn Guile. Ethelene Hal, M.D.  [address]  [place]  [place] [address]  Fax: [phone]

## 2011-02-09 NOTE — Procedures (Signed)
NAME:  Brad Fischer, Brad Fischer NO.:  [id number]   MEDICAL RECORD NO.:  [id number]          PATIENT TYPE:  REC   LOCATION:  TPC                          FACILITY:  [facility]   PHYSICIAN:  Erick Colace, M.D.DATE OF BIRTH:  04/30/1951   DATE OF PROCEDURE:  05/14/2005  DATE OF DISCHARGE:                                 OPERATIVE REPORT   PROCEDURE:  Acupuncture treatment #4.   Needles placed at DU4, BL52, BL23 bilaterally, as well as the four eyes of  the knee on the left side as well as left BL40.  Electrical stimulation  between the four knee points as well as crossed needles at BL52 and BL23  bilaterally, 4 hertz, 25 minutes.  The patient tolerated the procedure well.  Pain level 4/10 current.  I will see him back later this week.      Erick Colace, M.D.  Electronically Signed     AEK/MEDQ  D:  05/14/2005 15:28:35  T:  05/15/2005 06:30:42  Job:  [id number]   cc:   Caralyn Guile. Ethelene Hal, M.D.  [address]  [place]  [place] [address]  Fax: [phone]

## 2011-02-09 NOTE — Op Note (Signed)
NAME:  Brad Fischer, Brad Fischer NO.:  [id number]   MEDICAL RECORD NO.:  [id number]          PATIENT TYPE:  INP   LOCATION:  2899                         FACILITY:  [facility]   PHYSICIAN:  Sharolyn Douglas, M.D.        DATE OF BIRTH:  10-29-1950   DATE OF PROCEDURE:  01/10/2006  DATE OF DISCHARGE:                                 OPERATIVE REPORT   DIAGNOSES:  1.  L4-5 herniated nucleus pulposus.  2.  L4-5 degenerative disk disease.  3.  Chronic back and left-greater-than-right leg pain.   PROCEDURE:  1.  L4-5 laminectomy with wide decompression of the left L4 and L5 nerve      roots.  2.  L4-5 posterior spinal fusion.  3.  Transforaminal lumbar interbody fusion, L4-5, with placement of 11-mm      PEEK prosthetic cage.  4.  Pedicle screw instrumentation, L4-5, using the Abbott Spine System.  5.  Local autogenous bone graft supplemented with bone morphogenic protein.   SURGEON:  Sharolyn Douglas, M.D.   ASSISTANT:  Verlin Fester, P.A.   ANESTHESIA:  General endotracheal.   ESTIMATED BLOOD LOSS:  100 mL.   COMPLICATIONS:  None.   INDICATIONS:  The patient is a pleasant 60 year old male with progressive  back and left-greater-than-right leg pain.  His imaging studies show a disk  herniation at L4-5 was lateralizes to the left side.  He has had a diskogram  which showed concordant pain at L4-5.  He has failed to respond to  conservative measures and at this point has elected to undergo L4-5  decompression and fusion in hopes of improving his symptoms.  Risks and  benefits were reviewed.   PROCEDURE:  The patient was identified in the holding area and taken to the  operating room where he underwent general endotracheal anesthesia without  difficulty and given prophylactic IV antibiotics.  Neuro monitoring was  established in the form of SSEPs and lower extremity EMGs.  He was carefully  turned prone onto the Wilson frame, all bony prominences padded, face and  eyes protected at all  times, back prepped and draped in the usual sterile  fashion.  A midline incision was made from L3 down to L5.  Dissection was  carried sharply through the deep fascia, subperiosteal exposure carried out  to the tips of the transverse process of L4 and L5 bilaterally.  Intraoperative x-ray was taken to confirm levels.  Deep retractors were  placed.  We then placed pedicle screws at L4 and L5 using anatomic probing  technique.  Each pedicle starting point was identified and initiated with  the pedicle awl.  The Steffee pedicle probe was then used to cannulate the  pedicles and the pedicles were palpated with a ball-tip feeler; there were  no breeches.  Each pedicle hole was intact.  We placed 6.5 x 15-mm screws at  L4 and 6.5 x 45-mm screws at L5 bilaterally.  We had good screw purchase.  Each screw was then stimulated using triggered EMGs and there were no  deleterious changes.  We then turned our attention to  completing the  laminectomy at L4-5 by removing the entire spinous process of L4 and the  midline lamina and ligamentum flavum.  Care was taken to protect the  underlying dura.  We identified the left L4 and L5 nerve roots.  We  decompressed the roots and the lateral recess and out the respective  foramen.  We then turned our attention to completing the posterior spinal  fusion by decorticating the transverse process of L4 and L5 and packing the  local bone graft from the laminectomy into the gutters.  At this point, A  TLIF was performed.  The residual facet joint on the left side at L4-5 was  osteotomized.  The exiting and transversing nerve roots were identified.  Free-running EMGs were monitored.  The disk space was entered.  We  identified a central subligamentous disk herniation which was decompressed  back into the interspace using Epstein curettes.  We then performed a  radical diskectomy using specialized TLIF curettes.  The cartilaginous  endplates were scraped clean. The  disk space was dilated up to 11 mm.  We  packed the disk space with BMP sponges from the medium INFUSE kit along with  local bone graft from the laminectomy.  An 11-mm PEEK prosthetic cage was  then packed with a BMP sponge, inserted into the interspace, carefully  tamped anteriorly and across the midline and the free-running EMGs were  stable.  We placed short-segment titanium rods into the polyaxial screw  heads and applied compression across the L4-5 segment.  The wound was  irrigated.  Gelfoam was left over the exposed epidural space.  Sponge and  needle count was correct.  The deep fascia was closed with a #1 Vicryl  suture, subcutaneous layer closed with 2-0 Vicryl, followed by a running 3-0  subcuticular Vicryl suture on the skin edges, Benzoin and Steri-Strips  placed and sterile dressing applied.  The patient was turned supine,  extubated without difficulty, and transferred to Recovery in stable  condition, able to move his upper and lower extremities.      Sharolyn Douglas, M.D.  Electronically Signed     MC/MEDQ  D:  01/10/2006  T:  01/11/2006  Job:  [id number]

## 2011-09-25 HISTORY — PX: NOSE SURGERY: SHX723

## 2011-10-09 DIAGNOSIS — R569 Unspecified convulsions: Secondary | ICD-10-CM

## 2011-10-09 HISTORY — DX: Unspecified convulsions: R56.9

## 2012-04-04 ENCOUNTER — Ambulatory Visit
Admission: RE | Admit: 2012-04-04 | Discharge: 2012-04-04 | Disposition: A | Discharge status: Home / Self Care | Payer: Medicare Other | Source: Ambulatory Visit | Attending: Orthopaedic Surgery | Admitting: Orthopaedic Surgery

## 2012-04-04 ENCOUNTER — Other Ambulatory Visit: Payer: Self-pay | Admitting: Orthopaedic Surgery

## 2012-04-04 DIAGNOSIS — M542 Cervicalgia: Secondary | ICD-10-CM

## 2012-09-29 ENCOUNTER — Telehealth: Payer: Self-pay | Admitting: Gastroenterology

## 2012-09-29 NOTE — Telephone Encounter (Signed)
Pt was given his recall dates for colonoscopy and Endoscopy he was confused and thought the colon was three years and not 5 he will call with any questions or concerns

## 2012-10-24 ENCOUNTER — Other Ambulatory Visit: Payer: Medicare HMO

## 2012-10-24 ENCOUNTER — Encounter: Payer: Self-pay | Admitting: Gastroenterology

## 2012-10-24 ENCOUNTER — Ambulatory Visit (INDEPENDENT_AMBULATORY_CARE_PROVIDER_SITE_OTHER): Payer: Medicare HMO | Admitting: Gastroenterology

## 2012-10-24 VITALS — BP 118/84 | HR 53 | Ht 68.0 in | Wt 174.0 lb

## 2012-10-24 DIAGNOSIS — R197 Diarrhea, unspecified: Secondary | ICD-10-CM

## 2012-10-24 DIAGNOSIS — R143 Flatulence: Secondary | ICD-10-CM

## 2012-10-24 DIAGNOSIS — R141 Gas pain: Secondary | ICD-10-CM

## 2012-10-24 MED ORDER — METRONIDAZOLE 250 MG PO TABS: 250.0000 mg | ORAL_TABLET | Freq: Three times a day (TID) | ORAL | Status: AC

## 2012-10-24 NOTE — Patient Instructions (Addendum)
You will have labs checked today in the basement lab.  Please head down after you check out with the front desk  (celiac panel, stool for ova and parasites, giardia). Restart solid food. Empiric trial of flagyl antibiotic, call into your pharmacy. Would try cutting off all fiber supplements pending the results the above trials, tests. Call to report on your symptoms in 10-14 days.

## 2012-10-24 NOTE — Progress Notes (Signed)
Review of gastrointestinal problems:  1. Barrett's esophagus: EGD reports from 2009, 2008, 2007 and several predating that. The 2007 and 2008 EGDs document irregular Z line, pathology proved Barrett's mucosa without dysplasia (Dr. Jennye Boroughs) The 2009 EGD also documents irregular Z line however biopsies did not show Barrett's change. 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 2. Colon polyps: He had a colonoscopy in 2007 and this showed diverticulosis without polyps. Pt is very clear that a previous colonoscopy in 1990s showed precancerous polyps.  10/2010 colonoscopy no polyps; recall at 5 years.   HPI: This is a  very pleasant 62 year old man whom I last saw a little over year ago. He is here with his wife today.  8-10 months ago excessive flatulence.  Improved over the summer.  But has come back with a vengence.   He has no abdominal pains, no nausea. He has had no significant changes in his medicines. He takes dietary fiber supplements everyday. For the past week or so he has gone on a liquid diet in order to lose some weight and has noticed an improvement in his flatulence. He has had no real changes in his bowels, no bleeding.   Had parasites in 2009; was found to have several parasites (giardia, candida, tapeworm) by Dr. Alessandra Bevels.  Was given meds and dietary recommendations.      Past Medical History  Diagnosis Date  . Barrett's esophagus   . Anemia   . Anxiety   . Arthritis   . Chronic headaches   . Colon polyps   . Depression   . Diverticulosis   . Elevated cholesterol   . Interstitial cystitis   . Sleep apnea   . UTI (lower urinary tract infection)   . History of intestinal parasite     Past Surgical History  Procedure Date  . Spinal fusion   . Knee surgery     left  . Ankle surgery     right  . Hemorroidectomy   . Neck surgery   . Nose surgery   . Eye lid surgery   . Appendectomy   . Tonsillectomy     Current  Outpatient Prescriptions  Medication Sig Dispense Refill  . ALPRAZolam (XANAX) 0.5 MG tablet Take 0.5 mg by mouth at bedtime as needed.      Marland Kitchen aspirin 81 MG tablet Take 81 mg by mouth daily.      Marland Kitchen atenolol-chlorthalidone (TENORETIC) 100-25 MG per tablet Take 1 tablet by mouth daily. Take 1/2 tablet daily      . Cholecalciferol (VITAMIN D PO) Take 1 tablet by mouth daily.      . cyclobenzaprine (FLEXERIL) 10 MG tablet Take 10 mg by mouth 3 (three) times daily as needed.      . eletriptan (RELPAX) 40 MG tablet One tablet by mouth at onset of headache. May repeat in 2 hours if headache persists or recurs. may repeat in 2 hours if necessary      . HYDROcodone-acetaminophen (NORCO/VICODIN) 5-325 MG per tablet Take 1 tablet by mouth every 6 (six) hours as needed.      . Multiple Vitamin (MULTIVITAMIN) tablet Take 1 tablet by mouth daily.      Marland Kitchen omeprazole (PRILOSEC) 20 MG capsule Take 20 mg by mouth daily.      . pregabalin (LYRICA) 50 MG capsule Take 50 mg by mouth 2 (two) times daily.      . psyllium (METAMUCIL) 58.6 % powder Take 1  packet by mouth daily.      . simethicone (MYLICON) 125 MG chewable tablet Chew 125 mg by mouth every 6 (six) hours as needed.        Allergies as of 10/24/2012 - Review Complete 10/24/2012  Allergen Reaction Noted  . Codeine    . Sulfamethoxazole w-trimethoprim      Family History  Problem Relation Age of Onset  . Colon cancer Paternal Grandfather 48  . Lung cancer Father   . Breast cancer Mother   . COPD Mother   . Heart disease Mother   . Heart disease Maternal Grandfather   . Heart disease Maternal Grandmother     History   Social History  . Marital Status: Married    Spouse Name: N/A    Number of Children: N/A  . Years of Education: N/A   Occupational History  . retired Engineer, site    Social History Main Topics  . Smoking status: Never Smoker   . Smokeless tobacco: Never Used  . Alcohol Use: No  . Drug Use: No  . Sexually Active: Not  on file   Other Topics Concern  . Not on file   Social History Narrative  . No narrative on file      Physical Exam: BP 118/84  Pulse 53  Ht 5\' 8"  (1.727 m)  Wt 174 lb (78.926 kg)  BMI 26.46 kg/m2 Constitutional: generally well-appearing Psychiatric: alert and oriented x3 Abdomen: soft, nontender, nondistended, no obvious ascites, no peritoneal signs, normal bowel sounds     Assessment and plan: 63 y.o. male with excessive flatulence without any other significant GI symptoms  this is almost always a dietary issue. Perhaps he has malabsorption from celiac sprue and I'm going to get blood testing done for that. He has also had Giardia in the past and so I am going to empirically treat him with Flagyl course of one week. She will also get stool testing done to check for Giardia. If these measures aren't helpful I would have him come off all fiber supplements since these can certainly cause excessive gas. He will call to report on his symptoms in about 2 weeks.

## 2012-10-27 LAB — CELIAC PANEL 10
Endomysial Screen: NEGATIVE
Gliadin IgA: 14.6 U/mL (ref <20)
Gliadin IgG: 26.6 U/mL — ABNORMAL HIGH (ref <20)
Tissue Transglut Ab: 19 U/mL (ref <20)
Tissue Transglutaminase Ab, IgA: 4.6 U/mL (ref <20)

## 2012-10-30 ENCOUNTER — Encounter: Payer: Self-pay | Admitting: Gastroenterology

## 2012-11-03 ENCOUNTER — Telehealth: Payer: Self-pay | Admitting: Gastroenterology

## 2012-11-03 NOTE — Telephone Encounter (Signed)
FYI:  Pt has finished abx and started solid food diet.  Gas and bloating are much better.

## 2012-11-04 NOTE — Telephone Encounter (Signed)
Great, thanks

## 2012-12-01 ENCOUNTER — Telehealth: Payer: Self-pay | Admitting: *Deleted

## 2012-12-01 ENCOUNTER — Encounter: Payer: Self-pay | Admitting: Gastroenterology

## 2012-12-01 ENCOUNTER — Ambulatory Visit (AMBULATORY_SURGERY_CENTER): Payer: Medicare HMO | Admitting: *Deleted

## 2012-12-01 VITALS — Ht 68.0 in | Wt 175.0 lb

## 2012-12-01 DIAGNOSIS — R894 Abnormal immunological findings in specimens from other organs, systems and tissues: Secondary | ICD-10-CM

## 2012-12-01 DIAGNOSIS — K3189 Other diseases of stomach and duodenum: Secondary | ICD-10-CM

## 2012-12-01 DIAGNOSIS — R1013 Epigastric pain: Secondary | ICD-10-CM

## 2012-12-01 NOTE — Telephone Encounter (Signed)
Procedure changed to propofol day.  12/12/12 at 10:30am.  Pt notified and new times reviewed with pt and his wife.

## 2012-12-01 NOTE — Telephone Encounter (Signed)
Let's change to MAC, propofol day at [facility] given his trouble with sedation in the past.  Thanks

## 2012-12-01 NOTE — Telephone Encounter (Signed)
Mr. Mathe is concerned about his coming Endo and the sedation.  He received maximum fentanyl and versed plus benadryl for his colon and an additional 3mg  versed for his Endo.  I explained that if he is changed to the deeper sedation, he'll have to be rescheduled on a day there is a Scientist, clinical (histocompatibility and immunogenetics) here.  He said he'd go with Dr. Christella Hartigan recommendation.  He is scheduled for [facility] 12/15/12 at 10:00am.

## 2012-12-01 NOTE — Progress Notes (Signed)
Mr. Lewallen is concerned about his coming Endo and the sedation.  He received maximum fentanyl and versed plus benadryl for his colon and an additional 3mg  versed for his Endo.  I explained that if he is changed to the deeper sedation, he'll have to be rescheduled on a day there is a Scientist, clinical (histocompatibility and immunogenetics) here.  He said he'd go with Dr. Christella Hartigan recommendation.

## 2012-12-12 ENCOUNTER — Ambulatory Visit (AMBULATORY_SURGERY_CENTER): Payer: Medicare HMO | Admitting: Gastroenterology

## 2012-12-12 ENCOUNTER — Encounter: Payer: Self-pay | Admitting: Gastroenterology

## 2012-12-12 VITALS — BP 122/76 | HR 49 | Temp 97.6°F | Resp 23 | Ht 68.0 in | Wt 175.0 lb

## 2012-12-12 DIAGNOSIS — K2970 Gastritis, unspecified, without bleeding: Secondary | ICD-10-CM

## 2012-12-12 DIAGNOSIS — K2990 Gastroduodenitis, unspecified, without bleeding: Secondary | ICD-10-CM

## 2012-12-12 DIAGNOSIS — K3189 Other diseases of stomach and duodenum: Secondary | ICD-10-CM

## 2012-12-12 DIAGNOSIS — K449 Diaphragmatic hernia without obstruction or gangrene: Secondary | ICD-10-CM

## 2012-12-12 DIAGNOSIS — K2940 Chronic atrophic gastritis without bleeding: Secondary | ICD-10-CM

## 2012-12-12 MED ORDER — SODIUM CHLORIDE 0.9 % IV SOLN: 500.0000 mL | INTRAVENOUS | Status: DC

## 2012-12-12 NOTE — Patient Instructions (Addendum)
YOU HAD AN ENDOSCOPIC PROCEDURE TODAY AT [facility]: Refer to the procedure report that was given to you for any specific questions about what was found during the examination.  If the procedure report does not answer your questions, please call your gastroenterologist to clarify.  If you requested that your care partner not be given the details of your procedure findings, then the procedure report has been included in a sealed envelope for you to review at your convenience later.  YOU SHOULD EXPECT: Some feelings of bloating in the abdomen. Passage of more gas than usual.  Walking can help get rid of the air that was put into your GI tract during the procedure and reduce the bloating. If you had a lower endoscopy (such as a colonoscopy or flexible sigmoidoscopy) you may notice spotting of blood in your stool or on the toilet paper. If you underwent a bowel prep for your procedure, then you may not have a normal bowel movement for a few days.  DIET: Your first meal following the procedure should be a light meal and then it is ok to progress to your normal diet.  A half-sandwich or bowl of soup is an example of a good first meal.  Heavy or fried foods are harder to digest and may make you feel nauseous or bloated.  Likewise meals heavy in dairy and vegetables can cause extra gas to form and this can also increase the bloating.  Drink plenty of fluids but you should avoid alcoholic beverages for 24 hours.  ACTIVITY: Your care partner should take you home directly after the procedure.  You should plan to take it easy, moving slowly for the rest of the day.  You can resume normal activity the day after the procedure however you should NOT DRIVE or use heavy machinery for 24 hours (because of the sedation medicines used during the test).    SYMPTOMS TO REPORT IMMEDIATELY: A gastroenterologist can be reached at any hour.  During normal business hours, 8:30 AM to 5:00 PM Monday through Friday,  call [phone].  After hours and on weekends, please call the GI answering service at [phone]  Emergency number who will take a message and have the physician on call contact you.   Following upper endoscopy (EGD)  Vomiting of blood or coffee ground material  New chest pain or pain under the shoulder blades  Painful or persistently difficult swallowing  New shortness of breath  Fever of 100F or higher  Black, tarry-looking stools  FOLLOW UP: If any biopsies were taken you will be contacted by phone or by letter within the next 1-3 weeks.  Call your gastroenterologist if you have not heard about the biopsies in 3 weeks.  Our staff will call the home number listed on your records the next business day following your procedure to check on you and address any questions or concerns that you may have at that time regarding the information given to you following your procedure. This is a courtesy call and so if there is no answer at the home number and we have not heard from you through the emergency physician on call, we will assume that you have returned to your regular daily activities without incident.  SIGNATURES/CONFIDENTIALITY: You and/or your care partner have signed paperwork which will be entered into your electronic medical record.  These signatures attest to the fact that that the information above on your After Visit Summary has been reviewed and is understood.  Full responsibility of the confidentiality of this discharge information lies with you and/or your care-partner.  Handout on gastritis, hiatal hernia

## 2012-12-12 NOTE — Progress Notes (Signed)
Patient did not experience any of the following events: a burn prior to discharge; a fall within the facility; wrong site/side/patient/procedure/implant event; or a hospital transfer or hospital admission upon discharge from the facility. (G8907)Patient did not have preoperative order for IV antibiotic SSI prophylaxis. (G8918) ewm 

## 2012-12-12 NOTE — Progress Notes (Signed)
Called to room to assist during endoscopic procedure.  Patient ID and intended procedure confirmed with present staff. Received instructions for my participation in the procedure from the performing physician.  

## 2012-12-12 NOTE — Op Note (Signed)
[facility] [address] [place], [address]   ENDOSCOPY PROCEDURE REPORT  PATIENT: Brad Fischer, Brad Fischer  MR#: [id number] BIRTHDATE: Oct 30, 1950 , 61  yrs. old GENDER: Male ENDOSCOPIST: Rachael Fee, MD PROCEDURE DATE:  12/12/2012 PROCEDURE:  EGD w/ biopsy ASA CLASS:     Class II INDICATIONS:  Dyspepsia: H/o Barrett's esophagus: EGD reports from 2009, 2008, 2007 and several predating that.  The 2007 and 2008 EGDs document irregular Z line, pathology proved Barrett's mucosa without dysplasia (Dr.  Jennye Boroughs) The 2009 EGD also documents irregular Z line however biopsies did not show Barrett's change. 2.2012 EGD found irregular z line: biopsies Positive for IM without dysplasia, 3 year recall.Marland Kitchen MEDICATIONS: MAC sedation, administered by CRNA and propofol (Diprivan) 150mg  IV TOPICAL ANESTHETIC: Cetacaine Spray  DESCRIPTION OF PROCEDURE: After the risks benefits and alternatives of the procedure were thoroughly explained, informed consent was obtained.  The LB-GIF Q180 [id number] endoscope was introduced through the mouth and advanced to the second portion of the duodenum. Without limitations.  The instrument was slowly withdrawn as the mucosa was fully examined.    There was moderate, non-specific distal gastritis.  This was biopsied and sent to pathology.  The duodenum was normal but biopsies were also taken given elevated (single) celiac spure marker.  There was slightly irregular z-line, non-nodular above a 2cm hiatal hernia.  The examination was otherwise normal. Retroflexed views revealed no abnormalities.     The scope was then withdrawn from the patient and the procedure completed. COMPLICATIONS: There were no complications.  ENDOSCOPIC IMPRESSION: There was moderate, non-specific distal gastritis.  This was biopsied and sent to pathology.  The duodenum was normal but biopsies were also taken given elevated (single) celiac spure marker.  There was  slightly irregular z-line, non-nodular above a 2cm hiatal hernia.  The examination was otherwise normal.  RECOMMENDATIONS: Await final biopsy results.   eSigned:  Rachael Fee, MD 12/12/2012 10:24 AM   CC:  Amada Kingfisher, MD

## 2012-12-15 ENCOUNTER — Telehealth: Payer: Self-pay | Admitting: *Deleted

## 2012-12-15 ENCOUNTER — Other Ambulatory Visit: Payer: Medicare HMO | Admitting: Gastroenterology

## 2012-12-15 NOTE — Telephone Encounter (Signed)
  Follow up Call-  Call back number 12/12/2012  Post procedure Call Back phone  # [phone]  Permission to leave phone message Yes     Patient questions:  Do you have a fever, pain , or abdominal swelling? no Pain Score  0 *  Have you tolerated food without any problems? yes  Have you been able to return to your normal activities? yes  Do you have any questions about your discharge instructions: Diet   no Medications  no Follow up visit  no  Do you have questions or concerns about your Care? no  Actions: * If pain score is 4 or above: No action needed, pain <4.

## 2012-12-22 ENCOUNTER — Encounter: Payer: Self-pay | Admitting: Gastroenterology

## 2013-10-16 ENCOUNTER — Encounter: Payer: Self-pay | Admitting: Gastroenterology

## 2013-10-18 HISTORY — PX: OTHER SURGICAL HISTORY: SHX169

## 2013-11-09 ENCOUNTER — Telehealth: Payer: Self-pay | Admitting: Gastroenterology

## 2013-11-09 NOTE — Telephone Encounter (Signed)
ok 

## 2013-11-09 NOTE — Telephone Encounter (Signed)
FYI

## 2013-12-23 HISTORY — PX: BLADDER SURGERY: SHX569

## 2013-12-23 HISTORY — PX: OTHER SURGICAL HISTORY: SHX169

## 2014-05-25 HISTORY — PX: BLADDER SURGERY: SHX569

## 2014-05-26 ENCOUNTER — Encounter: Payer: Self-pay | Admitting: Gastroenterology

## 2014-08-13 ENCOUNTER — Ambulatory Visit: Payer: Medicare HMO | Admitting: Gastroenterology

## 2014-08-13 ENCOUNTER — Telehealth: Payer: Self-pay | Admitting: *Deleted

## 2014-08-13 NOTE — Telephone Encounter (Signed)
Called patient back. I advised patient that he is scheduled today to see Dr. Christella HartiganJacobs for a recall EGD. I advised patient I talked to Dr. Christella HartiganJacobs in regards to this appointment and he agreed unless he(patient) is having new issues or new symptoms that have come up then he(patient) does not need an office visit.  I advised patient that his last EGD stated what is below: Notes Recorded by Rachael Feeaniel P Jacobs, MD on 12/22/2012 at 12:56 PM No recall . BArrett's is really gone  I advised patient I' am not sure why he received a letter for a recall EGD, Dr. Christella HartiganJacobs unsure as well, may have been in error. I advised patient if he is not having symptoms or any issues that need to be discussed with Dr. Christella HartiganJacobs then he will not need an office visit today. Patient stated that he feels great, has been taking Prilosec and has been doing lots of walking to lose weight. Patient stated that he is fine with not coming in today, he actually was unsure why he needed appointment as well.  I advised patient he will be due for a yearly follow up for 09-2014. I advised patient that January schedule is full but we did schedule for 10-29-2014 at 9 am. I advised patient that if he develops any new issues to please call the office.  Patient verbalized understanding

## 2014-08-13 NOTE — Telephone Encounter (Signed)
I cannot tell why this appointment was scheduled.  Agree that he does not need any surveillance endoscopies.  Not sure if he has a new issue to discuss. Could you check with him.

## 2014-08-13 NOTE — Telephone Encounter (Signed)
Dr. Christella HartiganJacobs,   I' am your primary person this afternoon and I was going back over the charts that Patty checked to make sure there where no changes. I ran across this patient on the schedule can you please look over this patients chart for me. He is scheduled with you this afternoon but I' am not sure that the appointment is necessary, it states recall EGD. Does not look like EGD is needed at this time. Please advise  Thank you

## 2014-10-29 ENCOUNTER — Encounter: Payer: Self-pay | Admitting: Gastroenterology

## 2014-10-29 ENCOUNTER — Ambulatory Visit (INDEPENDENT_AMBULATORY_CARE_PROVIDER_SITE_OTHER): Payer: Medicare HMO | Admitting: Gastroenterology

## 2014-10-29 VITALS — BP 120/70 | HR 68 | Ht 68.0 in | Wt 156.0 lb

## 2014-10-29 DIAGNOSIS — K2270 Barrett's esophagus without dysplasia: Secondary | ICD-10-CM

## 2014-10-29 NOTE — Patient Instructions (Addendum)
You will be set up for an upper endoscopy given history of Barrett's. Stay on prilosec once daily.  RU:[id number]:Caroline Prochnau

## 2014-10-29 NOTE — Progress Notes (Signed)
Review of gastrointestinal problems:  1. Barrett's esophagus: EGD reports from 2009, 2008, 2007 and several predating that. The 2007 and 2008 EGDs document irregular Z line, pathology proved Barrett's mucosa without dysplasia (Dr. Jennye BoroughsMisenheimer) The 2009 EGD also documents irregular Z line however biopsies did not show Barrett's change. 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  EGD Christella HartiganJacobs 11/2012 (for dyspepsia, and equivocal sprue markers; biopsies of duodenum normal.  Z line was slightly irregular, non-nodular (not biopsied?) 2. Colon polyps: He had a colonoscopy in 2007 and this showed diverticulosis without polyps. Pt is very clear that a previous colonoscopy in 1990s showed precancerous polyps. 10/2010 colonoscopy no polyps; recall at 5 years.  HPI: This is a   very pleasant 64 year old man who is here with his wife today.  I last saw him about a year and a half ago.  Was dealing with prostate issues in 2015, [facility]; benign disease, ended with bladder surgery twice, prostate surgery.  Cataract right eye.  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 Dieting 1000 cal per day.  No real GI issues, takes OTC prilosec and if he misses it he will have dyspepsia.  Large pills (only) cause dyspahgai.  Food no problems.  Past Medical History  Diagnosis Date  . Barrett's esophagus   . Anemia   . Anxiety   . Arthritis   . Chronic headaches   . Colon polyps   . Depression   . Diverticulosis   . Elevated cholesterol   . Interstitial cystitis   . Sleep apnea   . UTI (lower urinary tract infection)   . History of intestinal parasite   . Allergy     egg whites  . Cataract   . GERD (gastroesophageal reflux disease)   . Glaucoma   . Hypertension   . Seizures 10/09/2011    In ER from severe headache    Past Surgical History  Procedure Laterality Date  . Spinal fusion  2007    lower  . Knee  surgery      left  . Ankle surgery      right  . Hemorroidectomy    . Nose surgery  2013  . Eye lid surgery    . Appendectomy    . Tonsillectomy    . Cervical fusion  2008  . Cataract removed Right 10/18/13  . Cyst removed      Right middle finger  . Prostate removed  12/2013    [facility] [place]  . Bladder surgery  12/2013    [facility] [place]  . Bladder surgery  05/2014    Current Outpatient Prescriptions  Medication Sig Dispense Refill  . ALPRAZolam (XANAX) 0.5 MG tablet Take 0.5 mg by mouth at bedtime as needed.    Marland Kitchen. atenolol-chlorthalidone (TENORETIC) 100-25 MG per tablet Take 1 tablet by mouth daily. Take 1/2 tablet daily    . Cholecalciferol (VITAMIN D PO) Take 1 tablet by mouth daily.    . cyclobenzaprine (FLEXERIL) 10 MG tablet Take 10 mg by mouth 3 (three) times daily as needed.    . eletriptan (RELPAX) 40 MG tablet One tablet by mouth at onset of headache. May repeat in 2 hours if headache persists or recurs. may repeat in 2 hours if necessary    . HYDROcodone-acetaminophen (NORCO/VICODIN) 5-325 MG per tablet Take 1 tablet by mouth every 6 (six) hours as needed.    .Marland Kitchen  lisinopril (PRINIVIL,ZESTRIL) 10 MG tablet Take 10 mg by mouth daily.     . metFORMIN (GLUCOPHAGE-XR) 500 MG 24 hr tablet Take 500 mg by mouth daily.   6  . Multiple Vitamin (MULTIVITAMIN) tablet Take 1 tablet by mouth daily.    Marland Kitchen omeprazole (PRILOSEC) 20 MG capsule Take 20 mg by mouth daily.    . psyllium (METAMUCIL) 58.6 % powder Take 1 packet by mouth daily.    . rizatriptan (MAXALT) 10 MG tablet Take 10 mg by mouth as needed.   4  . simethicone (MYLICON) 125 MG chewable tablet Chew 125 mg by mouth every 6 (six) hours as needed.     No current facility-administered medications for this visit.    Allergies as of 10/29/2014 - Review Complete 10/29/2014  Allergen Reaction Noted  . Dutasteride Anaphylaxis 10/29/2014  . Codeine    . Sulfamethoxazole-trimethoprim Other (See Comments)      Family History  Problem Relation Age of Onset  . Colon cancer Paternal Grandfather 54  . Lung cancer Father   . Esophageal cancer Father   . Prostate cancer Father   . Breast cancer Mother   . COPD Mother   . Heart disease Mother   . Heart disease Maternal Grandfather   . Heart disease Maternal Grandmother     History   Social History  . Marital Status: Married    Spouse Name: N/A    Number of Children: N/A  . Years of Education: N/A   Occupational History  . retired Engineer, site    Social History Main Topics  . Smoking status: Former Games developer  . Smokeless tobacco: Never Used  . Alcohol Use: No  . Drug Use: No  . Sexual Activity: Not on file   Other Topics Concern  . Not on file   Social History Narrative      Physical Exam: BP 120/70 mmHg  Pulse 68  Ht  (1.727 m)  Wt 156 lb (70.761 kg)  BMI 23.73 kg/m2 Constitutional: generally well-appearing Psychiatric: alert and oriented x3 Abdomen: soft, nontender, nondistended, no obvious ascites, no peritoneal signs, normal bowel sounds     Assessment and plan: 64 y.o. male with history of Barrett's without dysplasia, chronic GERD  Recent very intentional weight loss when he was told he had borderline diabetes. He is otherwise doing very well. We discussed Barrett's surveillance and I recommended repeat EGD to check the Z line, repeat biopsies to check for Barrett's, dysplasia. He will continue on proton pump inhibitor once daily.

## 2014-11-17 ENCOUNTER — Encounter: Payer: Self-pay | Admitting: Gastroenterology

## 2014-12-28 ENCOUNTER — Ambulatory Visit (AMBULATORY_SURGERY_CENTER): Payer: Medicare PPO | Admitting: Gastroenterology

## 2014-12-28 ENCOUNTER — Encounter: Payer: Self-pay | Admitting: Gastroenterology

## 2014-12-28 VITALS — BP 103/65 | HR 54 | Temp 96.1°F | Resp 18 | Ht 68.0 in | Wt 156.0 lb

## 2014-12-28 DIAGNOSIS — K2990 Gastroduodenitis, unspecified, without bleeding: Secondary | ICD-10-CM

## 2014-12-28 DIAGNOSIS — K2970 Gastritis, unspecified, without bleeding: Secondary | ICD-10-CM

## 2014-12-28 DIAGNOSIS — K2270 Barrett's esophagus without dysplasia: Secondary | ICD-10-CM

## 2014-12-28 LAB — GLUCOSE, CAPILLARY
GLUCOSE-CAPILLARY: 75 mg/dL (ref 70–99)
GLUCOSE-CAPILLARY: 80 mg/dL (ref 70–99)

## 2014-12-28 MED ORDER — SODIUM CHLORIDE 0.9 % IV SOLN: 500.0000 mL | INTRAVENOUS | Status: DC

## 2014-12-28 NOTE — Patient Instructions (Signed)
YOU HAD AN ENDOSCOPIC PROCEDURE TODAY AT THE [facility]:   Refer to the procedure report that was given to you for any specific questions about what was found during the examination.  If the procedure report does not answer your questions, please call your gastroenterologist to clarify.  If you requested that your care partner not be given the details of your procedure findings, then the procedure report has been included in a sealed envelope for you to review at your convenience later.  YOU SHOULD EXPECT: Some feelings of bloating in the abdomen. Passage of more gas than usual.  Walking can help get rid of the air that was put into your GI tract during the procedure and reduce the bloating. If you had a lower endoscopy (such as a colonoscopy or flexible sigmoidoscopy) you may notice spotting of blood in your stool or on the toilet paper. If you underwent a bowel prep for your procedure, you may not have a normal bowel movement for a few days.  Please Note:  You might notice some irritation and congestion in your nose or some drainage.  This is from the oxygen used during your procedure.  There is no need for concern and it should clear up in a day or so.  SYMPTOMS TO REPORT IMMEDIATELY:   Following upper endoscopy (EGD)  Vomiting of blood or coffee ground material  New chest pain or pain under the shoulder blades  Painful or persistently difficult swallowing  New shortness of breath  Fever of 100F or higher  Black, tarry-looking stools  For urgent or emergent issues, a gastroenterologist can be reached at any hour by calling [phone].   DIET: Your first meal following the procedure should be a small meal and then it is ok to progress to your normal diet. Heavy or fried foods are harder to digest and may make you feel nauseous or bloated.  Likewise, meals heavy in dairy and vegetables can increase bloating.  Drink plenty of fluids but you should avoid alcoholic beverages for  24 hours.  ACTIVITY:  You should plan to take it easy for the rest of today and you should NOT DRIVE or use heavy machinery until tomorrow (because of the sedation medicines used during the test).    FOLLOW UP: Our staff will call the number listed on your records the next business day following your procedure to check on you and address any questions or concerns that you may have regarding the information given to you following your procedure. If we do not reach you, we will leave a message.  However, if you are feeling well and you are not experiencing any problems, there is no need to return our call.  We will assume that you have returned to your regular daily activities without incident.  If any biopsies were taken you will be contacted by phone or by letter within the next 1-3 weeks.  Please call us at [phone] if you have not heard about the biopsies in 3 weeks.    SIGNATURES/CONFIDENTIALITY: You and/or your care partner have signed paperwork which will be entered into your electronic medical record.  These signatures attest to the fact that that the information above on your After Visit Summary has been reviewed and is understood.  Full responsibility of the confidentiality of this discharge information lies with you and/or your care-partner.   Resume medications. Information given on Hiatal Hernia, gastritis with discharge instructions.

## 2014-12-28 NOTE — Progress Notes (Signed)
Called to room to assist during endoscopic procedure.  Patient ID and intended procedure confirmed with present staff. Received instructions for my participation in the procedure from the performing physician.  

## 2014-12-28 NOTE — Progress Notes (Signed)
Pts blood sugar 75.  He took his metformin last night.  D5 iv started piggy back.

## 2014-12-28 NOTE — Progress Notes (Signed)
Report to PACU, RN, vss, BBS= Clear.  

## 2014-12-28 NOTE — Op Note (Signed)
[facility] [address] [place], [id number]   ENDOSCOPY PROCEDURE REPORT  PATIENT: [name], Brad H  MR#: [id number] BIRTHDATE: 23-Feb-1951 , 63  yrs. old GENDER: male ENDOSCOPIST: Rachael Feeaniel P Donalee Gaumond, MD PROCEDURE DATE:  12/28/2014 PROCEDURE:  EGD w/ biopsy ASA CLASS:     Class II INDICATIONS:  Barrett's esophagus: EGD reports from 2009, 2008, 2007 and several predating that.  The 2007 and 2008 EGDs document irregular Z line, pathology proved Barrett's mucosa without dysplasia (Dr.  Jennye BoroughsMisenheimer) The 2009 EGD also documents irregular Z line however biopsies did not show Barrett's change.  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  EGD Christella HartiganJacobs 11/2012 (for dyspepsia, and equivocal sprue markers; biopsies of duodenum normal.  Z line was slightly irregular, non-nodular (not biopsied?).  BARRETT'S SURVEILLANCE. MEDICATIONS: Monitored anesthesia care and Propofol 200 mg IV TOPICAL ANESTHETIC: none  DESCRIPTION OF PROCEDURE: After the risks benefits and alternatives of the procedure were thoroughly explained, informed consent was obtained.  The LB [id number] endoscope was introduced through the mouth and advanced to the second portion of the duodenum , Without limitations.  The instrument was slowly withdrawn as the mucosa was fully examined.  There was mild non-specific gastritis in body of stomach along lesser curve.  This was rather focal but indistinct margins. Biopsies taken and sent to pathology.  The z-line was slightly irregular without nodularity.  This was biopsied and sent to pathology.  There was a 3-4cm hiatal hernia.  The examination was otherwise normal.  Retroflexed views revealed no abnormalities. The scope was then withdrawn from the patient and the procedure completed. COMPLICATIONS: There were no immediate complications.  ENDOSCOPIC IMPRESSION: There was mild non-specific gastritis in body  of stomach along lesser curve.  This was rather focal but indistinct margins. Biopsies taken and sent to pathology.  The z-line was slightly irregular without nodularity.  This was biopsied and sent to pathology.  There was a 3-4cm hiatal hernia.  The examination was otherwise normal  RECOMMENDATIONS: Await final pathology.   eSigned:  Rachael Feeaniel P Marcellius Montagna, MD 12/28/2014 9:16 AM    WN:[id number]:Brad Sudie BaileyProchnau, MD

## 2014-12-29 ENCOUNTER — Telehealth: Payer: Self-pay | Admitting: *Deleted

## 2014-12-29 NOTE — Telephone Encounter (Signed)
  Follow up Call-  Call back number 12/28/2014 12/12/2012  Post procedure Call Back phone  # [phone]  Permission to leave phone message Yes Yes     Patient questions:  Do you have a fever, pain , or abdominal swelling? No. Pain Score  0 *  Have you tolerated food without any problems? Yes.    Have you been able to return to your normal activities? Yes.    Do you have any questions about your discharge instructions: Diet   No. Medications  No. Follow up visit  No.  Do you have questions or concerns about your Care? No.  Actions: * If pain score is 4 or above: No action needed, pain <4.

## 2015-01-05 ENCOUNTER — Encounter: Payer: Self-pay | Admitting: Gastroenterology

## 2015-05-06 DIAGNOSIS — Z79891 Long term (current) use of opiate analgesic: Secondary | ICD-10-CM | POA: Diagnosis not present

## 2015-05-06 DIAGNOSIS — G894 Chronic pain syndrome: Secondary | ICD-10-CM | POA: Diagnosis not present

## 2015-05-06 DIAGNOSIS — Z79899 Other long term (current) drug therapy: Secondary | ICD-10-CM | POA: Diagnosis not present

## 2015-05-06 DIAGNOSIS — M47896 Other spondylosis, lumbar region: Secondary | ICD-10-CM | POA: Diagnosis not present

## 2015-05-06 DIAGNOSIS — M47892 Other spondylosis, cervical region: Secondary | ICD-10-CM | POA: Diagnosis not present

## 2015-05-06 DIAGNOSIS — M542 Cervicalgia: Secondary | ICD-10-CM | POA: Diagnosis not present

## 2015-05-16 DIAGNOSIS — H2512 Age-related nuclear cataract, left eye: Secondary | ICD-10-CM | POA: Diagnosis not present

## 2015-05-16 DIAGNOSIS — Z961 Presence of intraocular lens: Secondary | ICD-10-CM | POA: Diagnosis not present

## 2015-07-11 DIAGNOSIS — G894 Chronic pain syndrome: Secondary | ICD-10-CM | POA: Diagnosis not present

## 2015-07-11 DIAGNOSIS — M542 Cervicalgia: Secondary | ICD-10-CM | POA: Diagnosis not present

## 2015-08-01 DIAGNOSIS — H6121 Impacted cerumen, right ear: Secondary | ICD-10-CM | POA: Diagnosis not present

## 2015-08-01 DIAGNOSIS — I10 Essential (primary) hypertension: Secondary | ICD-10-CM | POA: Diagnosis not present

## 2015-08-01 DIAGNOSIS — R7301 Impaired fasting glucose: Secondary | ICD-10-CM | POA: Diagnosis not present

## 2015-08-01 DIAGNOSIS — Z79899 Other long term (current) drug therapy: Secondary | ICD-10-CM | POA: Diagnosis not present

## 2015-09-12 DIAGNOSIS — N401 Enlarged prostate with lower urinary tract symptoms: Secondary | ICD-10-CM | POA: Diagnosis not present

## 2015-09-12 DIAGNOSIS — N138 Other obstructive and reflux uropathy: Secondary | ICD-10-CM | POA: Diagnosis not present

## 2015-09-12 DIAGNOSIS — G894 Chronic pain syndrome: Secondary | ICD-10-CM | POA: Diagnosis not present

## 2015-12-15 ENCOUNTER — Encounter: Payer: Self-pay | Admitting: Gastroenterology

## 2016-03-09 ENCOUNTER — Encounter: Payer: Self-pay | Admitting: Allergy and Immunology

## 2016-03-09 ENCOUNTER — Ambulatory Visit (INDEPENDENT_AMBULATORY_CARE_PROVIDER_SITE_OTHER): Payer: Medicare Other | Admitting: Allergy and Immunology

## 2016-03-09 VITALS — BP 120/82 | HR 64 | Temp 98.2°F | Resp 16 | Ht 66.0 in | Wt 132.3 lb

## 2016-03-09 DIAGNOSIS — W57XXXA Bitten or stung by nonvenomous insect and other nonvenomous arthropods, initial encounter: Secondary | ICD-10-CM

## 2016-03-09 DIAGNOSIS — T148 Other injury of unspecified body region: Secondary | ICD-10-CM | POA: Diagnosis not present

## 2016-03-09 NOTE — Progress Notes (Signed)
Dear Dr. Sudie Bailey,  Thank you for referring Brad Fischer to the [facility] on 03/09/2016.   Below is a summation of this patient's evaluation and recommendations.  Thank you for your referral. I will keep you informed about this patient's response to treatment.   If you have any questions please to do hestitate to contact me.   Sincerely,  Jessica Priest, MD [facility]   ______________________________________________________________________    NEW PATIENT NOTE  Referring Provider: Philemon Kingdom, MD Primary Provider: Philemon Kingdom, MD Date of office visit: 03/09/2016    Subjective:   Chief Complaint:  Brad Fischer (DOB: 29-May-1951) is a 65 y.o. male with a chief complaint of Other  who presents to the clinic on 03/09/2016 with the following problems:  HPI: Brad Fischer presents this clinic in evaluation of possible alpha gal syndrome. 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 Recently he had blood tests performed by his primary care doctor in investigation of a possible tick borne disease. Interestingly, his alpha gal IgE titer came back at 0.50 KU/ML. He does not have a history of having adverse effects secondary to consumption of any food products including mammals. He does not have an atopic history and does not have any other organ system involvement with atopic disease. The question at hand is whether or not this low titer of alpha gal antibody is clinically significant.  Past Medical History  Diagnosis Date  . Barrett's esophagus   . Anemia   . Anxiety   . Arthritis   . Chronic headaches   . Colon polyps   . Depression   . Diverticulosis   . Elevated cholesterol   . Interstitial cystitis   . Sleep apnea   . UTI (lower urinary tract infection)   . History of intestinal parasite    . Allergy     egg whites  . Cataract   . GERD (gastroesophageal reflux disease)   . Glaucoma   . Hypertension   . Seizures (HCC) 10/09/2011    In ER from severe headache    Past Surgical History  Procedure Laterality Date  . Spinal fusion  2007    lower  . Knee surgery      left  . Ankle surgery      right  . Hemorroidectomy    . Nose surgery  2013  . Eye lid surgery    . Appendectomy    . Tonsillectomy    . Cervical fusion  2008  . Cataract removed Right 10/18/13  . Cyst removed      Right middle finger  . Prostate removed  12/2013    [facility] [place]  . Bladder surgery  12/2013    [facility]  . Bladder surgery  05/2014  . Sinoscopy        Medication List           ALPRAZolam 0.5 MG tablet  Commonly known as:  XANAX  Take 0.5 mg by mouth at bedtime as needed.     atenolol 25 MG tablet  Commonly known as:  TENORMIN  Take 12.5 mg by mouth daily.     calcium citrate-vitamin D 315-200 MG-UNIT tablet  Commonly known as:  CITRACAL+D  Take 1 tablet by mouth 2 (two) times daily.     chlorthalidone 25  MG tablet  Commonly known as:  HYGROTON     cyclobenzaprine 10 MG tablet  Commonly known as:  FLEXERIL  Take 10 mg by mouth 3 (three) times daily as needed.     eletriptan 40 MG tablet  Commonly known as:  RELPAX  One tablet by mouth at onset of headache. May repeat in 2 hours if headache persists or recurs. may repeat in 2 hours if necessary     EPINEPHrine 0.3 mg/0.3 mL Soaj injection  Commonly known as:  EPI-PEN  See admin instructions.     HYDROcodone-acetaminophen 5-325 MG tablet  Commonly known as:  NORCO/VICODIN  Take 1 tablet by mouth every 6 (six) hours as needed.     lisinopril 10 MG tablet  Commonly known as:  PRINIVIL,ZESTRIL  Take 10 mg by mouth daily.     Melatonin 10 MG Caps  Take by mouth.     metFORMIN 500 MG 24 hr tablet  Commonly known as:  GLUCOPHAGE-XR  Take 500 mg by mouth daily.     MIRALAX PO  Take by  mouth.     multivitamin tablet  Take 1 tablet by mouth daily.     vitamin E 100 UNIT capsule  Take by mouth daily.        Allergies  Allergen Reactions  . Dutasteride Anaphylaxis  . Codeine     REACTION: itching  . Sulfamethoxazole Itching  . Sulfamethoxazole-Trimethoprim Other (See Comments)    Severe headaches    Review of systems negative except as noted in HPI / PMHx or noted below:  Review of Systems  Constitutional: Negative.   HENT: Negative.   Eyes: Negative.   Respiratory: Negative.   Cardiovascular: Negative.   Gastrointestinal: Negative.   Genitourinary: Negative.   Musculoskeletal: Negative.   Skin: Negative.   Neurological: Negative.   Endo/Heme/Allergies: Negative.   Psychiatric/Behavioral: Negative.     Family History  Problem Relation Age of Onset  . Colon cancer Paternal Grandfather [id number]  . Lung cancer Father   . Esophageal cancer Father   . Prostate cancer Father   . Breast cancer Mother   . COPD Mother   . Heart disease Mother   . Heart disease Maternal Grandfather   . Heart disease Maternal Grandmother     Social History   Social History  . Marital Status: Married    Spouse Name: N/A  . Number of Children: N/A  . Years of Education: N/A   Occupational History  . retired Engineer, siteschool teacher    Social History Main Topics  . Smoking status: Former Smoker    Quit date: 09/25/2007  . Smokeless tobacco: Never Used  . Alcohol Use: No  . Drug Use: No  . Sexual Activity: Not on file   Other Topics Concern  . Not on file   Social History Narrative    Environmental and Social history  Noncontributory   Objective:   Filed Vitals:   03/09/16 0853  BP: 120/82  Pulse: 64  Temp: 98.2 F (36.8 C)  Resp: 16   Height: 5\' 6"  (167.6 cm) Weight: 132 lb 4.4 oz (60 kg)  Physical Exam  Constitutional: He is well-developed, well-nourished, and in no distress.  HENT:  Head: Normocephalic.  Right Ear: Tympanic membrane, external ear  and ear canal normal.  Left Ear: Tympanic membrane, external ear and ear canal normal.  Nose: Nose normal. No mucosal edema or rhinorrhea.  Mouth/Throat: Uvula is midline, oropharynx is clear and moist and mucous membranes are  normal. No oropharyngeal exudate.  Eyes: Conjunctivae are normal.  Neck: Trachea normal. No tracheal tenderness present. No tracheal deviation present. No thyromegaly present.  Cardiovascular: Normal rate, regular rhythm, S1 normal, S2 normal and normal heart sounds.   No murmur heard. Pulmonary/Chest: Breath sounds normal. No stridor. No respiratory distress. He has no wheezes. He has no rales.  Musculoskeletal: He exhibits no edema.  Lymphadenopathy:       Head (right side): No tonsillar adenopathy present.       Head (left side): No tonsillar adenopathy present.    He has no cervical adenopathy.  Neurological: He is alert. Gait normal.  Skin: No rash noted. He is not diaphoretic. No erythema. Nails show no clubbing.  Psychiatric: Mood and affect normal.     Diagnostics:  Blood obtained on 01/30/2016 identified an alpha gal IgE titer of 0.50 KU/ML without any antibodies directed against beef, lamb, or pork.   Assessment and Plan:    1. Tick bite     Brad Fischer does not have a history of significant hypersensitivity directed against mammal consumption and his low titer of alpha gal IgE does not place him at any increased risk for developing alpha gal syndrome from that risk found in the general population. I made no recommendation concerning any type of food avoidance at this point in time. He does carry an EpiPen when he goes on his archaeological explorations. He can certainly return to this clinic should he develop any problems in the future.  Jessica Priest, MD [facility]

## 2017-10-03 ENCOUNTER — Other Ambulatory Visit: Payer: Self-pay | Admitting: Orthopaedic Surgery

## 2017-10-03 DIAGNOSIS — M542 Cervicalgia: Secondary | ICD-10-CM

## 2017-10-07 ENCOUNTER — Other Ambulatory Visit: Payer: Self-pay | Admitting: Orthopedic Surgery

## 2017-10-08 ENCOUNTER — Ambulatory Visit
Admission: RE | Admit: 2017-10-08 | Discharge: 2017-10-08 | Disposition: A | Discharge status: Home / Self Care | Payer: Medicare Other | Source: Ambulatory Visit | Attending: Orthopaedic Surgery | Admitting: Orthopaedic Surgery

## 2017-10-08 DIAGNOSIS — M542 Cervicalgia: Secondary | ICD-10-CM

## 2017-11-04 HISTORY — PX: CERVICAL FUSION: SHX112

## 2017-11-05 ENCOUNTER — Other Ambulatory Visit: Payer: Self-pay | Admitting: Orthopedic Surgery

## 2017-11-26 NOTE — Pre-Procedure Instructions (Signed)
[facility]  11/26/2017      [facility] [place], [place] - [address] [place] [id number] Phone: [phone] Fax: [phone]  [facility] [place] - [address] [place] [address] Phone: [phone] Fax: [phone]    Your procedure is scheduled on Monday, December 09, 2017  [facility]A" at 8:00AM   Call this number if you have problems the morning of surgery:  [phone]   Remember:  Do not eat food or drink liquids after midnight.  Take these medicines the morning of surgery with A SIP OF WATER: Atenolol (TENORMIN), Omeprazole (PRILOSEC), and HYDROcodone-acetaminophen (NORCO/VICODIN). If needed Cyclobenzaprine (FLEXERIL) for spasms and EPINEPHrine for an allergic reaction (Bring with you the day of surgery).  7 days before surgery (Mar. 11), stop taking all Aspirins, Vitamins, Fish oils, and Herbal medications. Also stop all NSAIDS i.e. Advil, Ibuprofen, Motrin, Aleve, Anaprox, Naproxen, BC and Goody Powders. Including: Eletriptan (RELPAX)  How to Manage Your Diabetes Before and After Surgery  Why is it important to control my blood sugar before and after surgery? . Improving blood sugar levels before and after surgery helps healing and can limit problems. . A way of improving blood sugar control is eating a healthy diet by: o  Eating less sugar and carbohydrates o  Increasing activity/exercise o  Talking with your doctor about reaching your blood sugar goals . High blood sugars (greater than 180 mg/dL) can raise your risk of infections and slow your recovery, so you will need to focus on controlling your diabetes during the weeks before surgery. . Make sure that the doctor who takes care of your diabetes knows about your planned surgery including the date and location.  How do I manage my blood sugar before surgery? . Check your blood sugar at least 4 times a  day, starting 2 days before surgery, to make sure that the level is not too high or low. o Check your blood sugar the morning of your surgery when you wake up and every 2 hours until you get to the Short Stay unit. . If your blood sugar is less than 70 mg/dL, you will need to treat for low blood sugar: o Do not take insulin. o Treat a low blood sugar (less than 70 mg/dL) with  cup of clear juice (cranberry or apple), 4 glucose tablets, OR glucose gel. Recheck blood sugar in 15 minutes after treatment (to make sure it is greater than 70 mg/dL). If your blood sugar is not greater than 70 mg/dL on recheck, call [phone] o  for further instructions. . Report your blood sugar to the short stay nurse when you get to Short Stay.  . If you are admitted to the hospital after surgery: o Your blood sugar will be checked by the staff and you will probably be given insulin after surgery (instead of oral diabetes medicines) to make sure you have good blood sugar levels. o The goal for blood sugar control after surgery is 80-180 mg/dL.  WHAT DO I DO ABOUT MY DIABETES MEDICATION?  Marland Kitchen. Do not take MetFORMIN (GLUCOPHAGE-XR) the morning of surgery.  . If your CBG is greater than 220 mg/dL, call us at [phone]   Do not wear jewelry.  Do not wear lotions, powders, colognes, or deodorant.  Do not shave 48 hours prior to surgery.  Men may shave face and neck.  Do not bring valuables to the hospital.  [facility] is not responsible for any belongings or valuables.  Contacts, dentures or bridgework may not be worn into surgery.  Leave your suitcase in the car.  After surgery it may be brought to your room.  For patients admitted to the hospital, discharge time will be determined by your treatment team.  Patients discharged the day of surgery will not be allowed to drive home.   Special instructions:   [facility]- Preparing For Surgery  Before surgery, you can play an important role. Because skin is  not sterile, your skin needs to be as free of germs as possible. You can reduce the number of germs on your skin by washing with CHG (chlorahexidine gluconate) Soap before surgery.  CHG is an antiseptic cleaner which kills germs and bonds with the skin to continue killing germs even after washing.  Please do not use if you have an allergy to CHG or antibacterial soaps. If your skin becomes reddened/irritated stop using the CHG.  Do not shave (including legs and underarms) for at least 48 hours prior to first CHG shower. It is OK to shave your face.  Please follow these instructions carefully.   1. Shower the NIGHT BEFORE SURGERY and the MORNING OF SURGERY with CHG.   2. If you chose to wash your hair, wash your hair first as usual with your normal shampoo.  3. After you shampoo, rinse your hair and body thoroughly to remove the shampoo.  4. Use CHG as you would any other liquid soap. You can apply CHG directly to the skin and wash gently with a scrungie or a clean washcloth.   5. Apply the CHG Soap to your body ONLY FROM THE NECK DOWN.  Do not use on open wounds or open sores. Avoid contact with your eyes, ears, mouth and genitals (private parts). Wash Face and genitals (private parts)  with your normal soap.  6. Wash thoroughly, paying special attention to the area where your surgery will be performed.  7. Thoroughly rinse your body with warm water from the neck down.  8. DO NOT shower/wash with your normal soap after using and rinsing off the CHG Soap.  9. Pat yourself dry with a CLEAN TOWEL.  10. Wear CLEAN PAJAMAS to bed the night before surgery, wear comfortable clothes the morning of surgery  11. Place CLEAN SHEETS on your bed the night of your first shower and DO NOT SLEEP WITH PETS.  Day of Surgery: Do not apply any deodorants/lotions. Please wear clean clothes to the hospital/surgery center.    Please read over the following fact sheets that you were given. Pain Booklet,  Coughing and Deep Breathing, Total Joint Packet, MRSA Information and Surgical Site Infection Prevention

## 2017-11-27 ENCOUNTER — Encounter (HOSPITAL_COMMUNITY)
Admission: RE | Admit: 2017-11-27 | Discharge: 2017-11-27 | Disposition: A | Discharge status: Home / Self Care | Payer: Medicare Other | Source: Ambulatory Visit | Attending: Orthopedic Surgery | Admitting: Orthopedic Surgery

## 2017-11-27 ENCOUNTER — Other Ambulatory Visit: Payer: Self-pay

## 2017-11-27 ENCOUNTER — Encounter (HOSPITAL_COMMUNITY): Payer: Self-pay

## 2017-11-27 DIAGNOSIS — R7303 Prediabetes: Secondary | ICD-10-CM | POA: Insufficient documentation

## 2017-11-27 DIAGNOSIS — G4733 Obstructive sleep apnea (adult) (pediatric): Secondary | ICD-10-CM | POA: Insufficient documentation

## 2017-11-27 DIAGNOSIS — Z01812 Encounter for preprocedural laboratory examination: Secondary | ICD-10-CM | POA: Diagnosis present

## 2017-11-27 DIAGNOSIS — I10 Essential (primary) hypertension: Secondary | ICD-10-CM | POA: Diagnosis not present

## 2017-11-27 DIAGNOSIS — Z95811 Presence of heart assist device: Secondary | ICD-10-CM | POA: Insufficient documentation

## 2017-11-27 DIAGNOSIS — K219 Gastro-esophageal reflux disease without esophagitis: Secondary | ICD-10-CM | POA: Diagnosis not present

## 2017-11-27 DIAGNOSIS — E785 Hyperlipidemia, unspecified: Secondary | ICD-10-CM | POA: Insufficient documentation

## 2017-11-27 DIAGNOSIS — D649 Anemia, unspecified: Secondary | ICD-10-CM | POA: Diagnosis not present

## 2017-11-27 HISTORY — DX: Unilateral primary osteoarthritis, right knee: M17.11

## 2017-11-27 HISTORY — DX: Prediabetes: R73.03

## 2017-11-27 HISTORY — DX: Other specified postprocedural states: R11.2

## 2017-11-27 HISTORY — DX: Other complications of anesthesia, initial encounter: T88.59XA

## 2017-11-27 HISTORY — DX: Adverse effect of unspecified anesthetic, initial encounter: T41.45XA

## 2017-11-27 HISTORY — DX: Other specified postprocedural states: Z98.890

## 2017-11-27 LAB — COMPREHENSIVE METABOLIC PANEL
ALK PHOS: 64 U/L (ref 38–126)
ALT: 18 U/L (ref 17–63)
AST: 20 U/L (ref 15–41)
Albumin: 3.8 g/dL (ref 3.5–5.0)
Anion gap: 8 (ref 5–15)
BILIRUBIN TOTAL: 0.7 mg/dL (ref 0.3–1.2)
BUN: 12 mg/dL (ref 6–20)
CO2: 27 mmol/L (ref 22–32)
CREATININE: 0.8 mg/dL (ref 0.61–1.24)
Calcium: 9.3 mg/dL (ref 8.9–10.3)
Chloride: 105 mmol/L (ref 101–111)
GFR calc Af Amer: >60 mL/min (ref >60)
Glucose, Bld: 105 mg/dL — ABNORMAL HIGH (ref 65–99)
Potassium: 4.1 mmol/L (ref 3.5–5.1)
Sodium: 140 mmol/L (ref 135–145)
TOTAL PROTEIN: 6.2 g/dL — AB (ref 6.5–8.1)

## 2017-11-27 LAB — GLUCOSE, CAPILLARY: GLUCOSE-CAPILLARY: 92 mg/dL (ref 65–99)

## 2017-11-27 LAB — CBC WITH DIFFERENTIAL/PLATELET
BASOS ABS: 0 10*3/uL (ref 0.0–0.1)
Basophils Relative: 1 %
Eosinophils Absolute: 0.1 10*3/uL (ref 0.0–0.7)
Eosinophils Relative: 2 %
HEMATOCRIT: 45.9 % (ref 39.0–52.0)
Hemoglobin: 15 g/dL (ref 13.0–17.0)
LYMPHS PCT: 24 %
Lymphs Abs: 1.3 10*3/uL (ref 0.7–4.0)
MCH: 30 pg (ref 26.0–34.0)
MCHC: 32.7 g/dL (ref 30.0–36.0)
MCV: 91.8 fL (ref 78.0–100.0)
Monocytes Absolute: 0.3 10*3/uL (ref 0.1–1.0)
Monocytes Relative: 5 %
NEUTROS ABS: 3.9 10*3/uL (ref 1.7–7.7)
Neutrophils Relative %: 68 %
Platelets: 249 10*3/uL (ref 150–400)
RBC: 5 MIL/uL (ref 4.22–5.81)
RDW: 13.8 % (ref 11.5–15.5)
WBC: 5.7 10*3/uL (ref 4.0–10.5)

## 2017-11-27 LAB — SURGICAL PCR SCREEN
MRSA, PCR: NEGATIVE
Staphylococcus aureus: NEGATIVE

## 2017-11-27 NOTE — Progress Notes (Signed)
PCP - Dr. Philemon Kingdomaroline Prochnau-   Cardi[facility] - [facility]- Requested 2014  Chest x-ray - Denies  EKG - 10/18/17- (CE)- Requested  Stress Test - 2012  ECHO - Denies  Cardiac Cath - 2012-negative  Sleep Study - Yes- Positive CPAP - None  LABS- 11/27/17: CBC w/D, CMP  HA1C- 11/19/17: 5.6 Fasting Blood Sugar - Today, 92 Checks Blood Sugar _0____ times a day Pt sts he is pre-diabetic, and there is a note from Dr. Sudie BaileyProchnau stating the same and to give him a regular diet, int he physical chart. Pt still given diabetic instructions due to him taking Metformin.  Anesthesia- Yes- Requested records  Pt denies having chest pain, sob, or fever at this time. All instructions explained to the pt, with a verbal understanding of the material. Pt agrees to go over the instructions while at home for a better understanding. The opportunity to ask questions was provided.

## 2017-11-28 NOTE — Progress Notes (Signed)
Anesthesia Chart Review:  Patient is a 67 year old male scheduled for left total knee arthroplasty on 12/09/2017 with Dannielle HuhSteve Lucey, MD  - PCP is Philemon Kingdomaroline Prochnau, MD who cleared pt for surgery from medical and cardiac standpoint  - Saw cardiologist Bonnielee HaffKenneth Wallmeyer, MD in 2014 for atypical chest pain.  Stress test and cardiac CT ordered, results below. Pt does not routinely follow with cardiology  PMH includes: HTN, pre-diabetes, hyperlipidemia, anemia, OSA, seizure (2013), GERD.  Former smoker (quit 2009).  BMI 22.5. [facility]  Medications include: Atenolol, chlorthalidone, lisinopril, metformin, Prilosec  BP 137/89   Pulse 68   Temp 37.1 C   Resp 20   Ht 5\' 8"  (1.727 m)   Wt 148 lb 9.6 oz (67.4 kg)   SpO2 99%   BMI 22.59 kg/m   Preoperative labs reviewed.    EKG 10/18/17: Sinus bradycardia (45 bpm)  Coronary CTA 05/12/13 ([facility]): 1.  Calcium score 19 (42nd percentile for age/ray/gender) 2.  Coronary arteries: Mild nonobstructive CAD (mid LAD 20% stenosis; mid RCA 20-30% stenosis, distal RCA 20% stenosis 3.  LV function: Normal, EF 72%.  No regional wall motion abnormalities. 4.  The quality of the study is good.  Nuclear stress test 03/19/13 [facility]): 1.  No evidence of prior infarction or active ischemia is noted.  If no changes, I anticipate pt can proceed with surgery as scheduled.   Rica Mastngela Kabbe, FNP-BC [facility] Phone: [phone] 11/29/2017 3:59 PM

## 2017-12-06 MED ORDER — BUPIVACAINE LIPOSOME 1.3 % IJ SUSP
20.0000 mL | INTRAMUSCULAR | Status: AC
  Administered 2017-12-09: 20 mL
  Filled 2017-12-06: qty 20

## 2017-12-06 MED ORDER — TRANEXAMIC ACID 1000 MG/10ML IV SOLN
1000.0000 mg | INTRAVENOUS | Status: AC
  Administered 2017-12-09: 1000 mg via INTRAVENOUS
  Filled 2017-12-06: qty 1100

## 2017-12-06 NOTE — Anesthesia Preprocedure Evaluation (Addendum)
Anesthesia Evaluation  Patient identified by MRN, date of birth, ID band Patient awake    Reviewed: Allergy & Precautions, H&P , NPO status , Patient's Chart, lab work & pertinent test results  History of Anesthesia Complications (+) PONV  Airway Mallampati: III  TM Distance: >3 FB Neck ROM: Full    Dental no notable dental hx. (+) Teeth Intact, Dental Advisory Given   Pulmonary sleep apnea , former smoker,    Pulmonary exam normal breath sounds clear to auscultation       Cardiovascular Exercise Tolerance: Good hypertension, Pt. on medications and Pt. on home beta blockers  Rhythm:Regular Rate:Normal     Neuro/Psych  Headaches, Anxiety Depression    GI/Hepatic Neg liver ROS, GERD  Medicated and Controlled,  Endo/Other  negative endocrine ROS  Renal/GU negative Renal ROS  negative genitourinary   Musculoskeletal  (+) Arthritis , Osteoarthritis,    Abdominal   Peds  Hematology  (+) anemia ,   Anesthesia Other Findings   Reproductive/Obstetrics negative OB ROS                           Anesthesia Physical Anesthesia Plan  ASA: II  Anesthesia Plan: Spinal   Post-op Pain Management:  Regional for Post-op pain   Induction: Intravenous  PONV Risk Score and Plan: 3 and Ondansetron, Dexamethasone, Propofol infusion and Midazolam  Airway Management Planned: Simple Face Mask  Additional Equipment:   Intra-op Plan:   Post-operative Plan:   Informed Consent: I have reviewed the patients History and Physical, chart, labs and discussed the procedure including the risks, benefits and alternatives for the proposed anesthesia with the patient or authorized representative who has indicated Brad Fischer/her understanding and acceptance.   Dental advisory given  Plan Discussed with: CRNA  Anesthesia Plan Comments: (Oddono 3/15  I spoke extensively with Brad Fischer today regarding Brad Fischer anesthetic  options for Monday surgery.  He is concerned about being put asleep and Brad Fischer airway given the fact that he recently went through a cervical fusion (C2/3) and has residual dysphagia and neck discomfort.  I discussed spinal anesthesia and block for post op pain as first choice given Brad Fischer recent neck procedure.  I reviewed Brad Fischer MRI with him and in spite of Lumbar Surgery years ago feel that it is a reasonable option with high likelihood of success given that it was a single level.  MRI report posted below.  MRI IMPRESSION: 2010  1.  Status post L4-5 PLIF with wide decompression of the thecal sac and patent foramina. 2.  Mild central canal narrowing L3-4 due to a disc bulge and ligament flavum thickening. 3.  Shallow central protrusion and annular tear L5-S1.  Central canal and foramina remain open. )       Anesthesia Quick Evaluation

## 2017-12-09 ENCOUNTER — Ambulatory Visit (HOSPITAL_COMMUNITY): Payer: Medicare Other | Admitting: Certified Registered Nurse Anesthetist

## 2017-12-09 ENCOUNTER — Observation Stay (HOSPITAL_COMMUNITY)
Admission: RE | Admit: 2017-12-09 | Discharge: 2017-12-10 | Disposition: A | Discharge status: Home Health | Payer: Medicare Other | Source: Ambulatory Visit | Attending: Orthopedic Surgery | Admitting: Orthopedic Surgery

## 2017-12-09 ENCOUNTER — Ambulatory Visit (HOSPITAL_COMMUNITY): Payer: Medicare Other | Admitting: Emergency Medicine

## 2017-12-09 ENCOUNTER — Encounter (HOSPITAL_COMMUNITY): Payer: Self-pay | Admitting: *Deleted

## 2017-12-09 ENCOUNTER — Encounter (HOSPITAL_COMMUNITY)
Admission: RE | Disposition: A | Discharge status: Home Health | Payer: Self-pay | Source: Ambulatory Visit | Attending: Orthopedic Surgery

## 2017-12-09 DIAGNOSIS — Z79899 Other long term (current) drug therapy: Secondary | ICD-10-CM | POA: Insufficient documentation

## 2017-12-09 DIAGNOSIS — K219 Gastro-esophageal reflux disease without esophagitis: Secondary | ICD-10-CM | POA: Insufficient documentation

## 2017-12-09 DIAGNOSIS — Z885 Allergy status to narcotic agent status: Secondary | ICD-10-CM | POA: Insufficient documentation

## 2017-12-09 DIAGNOSIS — Z87891 Personal history of nicotine dependence: Secondary | ICD-10-CM | POA: Insufficient documentation

## 2017-12-09 DIAGNOSIS — M170 Bilateral primary osteoarthritis of knee: Secondary | ICD-10-CM | POA: Diagnosis not present

## 2017-12-09 DIAGNOSIS — F418 Other specified anxiety disorders: Secondary | ICD-10-CM | POA: Insufficient documentation

## 2017-12-09 DIAGNOSIS — Z7984 Long term (current) use of oral hypoglycemic drugs: Secondary | ICD-10-CM | POA: Insufficient documentation

## 2017-12-09 DIAGNOSIS — R7303 Prediabetes: Secondary | ICD-10-CM | POA: Diagnosis not present

## 2017-12-09 DIAGNOSIS — Z96659 Presence of unspecified artificial knee joint: Secondary | ICD-10-CM

## 2017-12-09 DIAGNOSIS — M1712 Unilateral primary osteoarthritis, left knee: Secondary | ICD-10-CM | POA: Diagnosis present

## 2017-12-09 DIAGNOSIS — Z882 Allergy status to sulfonamides status: Secondary | ICD-10-CM | POA: Insufficient documentation

## 2017-12-09 DIAGNOSIS — I10 Essential (primary) hypertension: Secondary | ICD-10-CM | POA: Diagnosis not present

## 2017-12-09 HISTORY — PX: TOTAL KNEE ARTHROPLASTY: SHX125

## 2017-12-09 LAB — GLUCOSE, CAPILLARY: Glucose-Capillary: 88 mg/dL (ref 65–99)

## 2017-12-09 SURGERY — ARTHROPLASTY, KNEE, TOTAL
Anesthesia: Spinal | Site: Knee | Laterality: Left

## 2017-12-09 MED ORDER — PHENYLEPHRINE HCL 10 MG/ML IJ SOLN
INTRAVENOUS | Status: DC | PRN
  Administered 2017-12-09: 15 ug/min via INTRAVENOUS

## 2017-12-09 MED ORDER — CHLORHEXIDINE GLUCONATE 4 % EX LIQD: 60.0000 mL | Freq: Once | CUTANEOUS | Status: DC

## 2017-12-09 MED ORDER — DEXAMETHASONE SODIUM PHOSPHATE 10 MG/ML IJ SOLN
10.0000 mg | Freq: Once | INTRAMUSCULAR | Status: AC
  Administered 2017-12-10: 10 mg via INTRAVENOUS
  Filled 2017-12-09: qty 1

## 2017-12-09 MED ORDER — GABAPENTIN 300 MG PO CAPS
300.0000 mg | ORAL_CAPSULE | Freq: Three times a day (TID) | ORAL | Status: DC
  Administered 2017-12-09 – 2017-12-10 (×4): 300 mg via ORAL
  Filled 2017-12-09 (×4): qty 1

## 2017-12-09 MED ORDER — PANTOPRAZOLE SODIUM 40 MG PO TBEC
40.0000 mg | DELAYED_RELEASE_TABLET | Freq: Every day | ORAL | Status: DC
  Administered 2017-12-10: 40 mg via ORAL
  Filled 2017-12-09 (×2): qty 1

## 2017-12-09 MED ORDER — DEXAMETHASONE SODIUM PHOSPHATE 10 MG/ML IJ SOLN
8.0000 mg | Freq: Once | INTRAMUSCULAR | Status: DC
  Filled 2017-12-09: qty 1

## 2017-12-09 MED ORDER — ELETRIPTAN HYDROBROMIDE 40 MG PO TABS
40.0000 mg | ORAL_TABLET | ORAL | Status: DC | PRN
  Administered 2017-12-10: 40 mg via ORAL
  Filled 2017-12-09 (×3): qty 1

## 2017-12-09 MED ORDER — ASPIRIN EC 325 MG PO TBEC
325.0000 mg | DELAYED_RELEASE_TABLET | Freq: Two times a day (BID) | ORAL | Status: DC
  Administered 2017-12-09 – 2017-12-10 (×2): 325 mg via ORAL
  Filled 2017-12-09 (×2): qty 1

## 2017-12-09 MED ORDER — FENTANYL CITRATE (PF) 100 MCG/2ML IJ SOLN
INTRAMUSCULAR | Status: AC
  Administered 2017-12-09: 50 ug
  Filled 2017-12-09: qty 2

## 2017-12-09 MED ORDER — DOCUSATE SODIUM 100 MG PO CAPS
100.0000 mg | ORAL_CAPSULE | Freq: Two times a day (BID) | ORAL | Status: DC
  Administered 2017-12-09 – 2017-12-10 (×2): 100 mg via ORAL
  Filled 2017-12-09 (×2): qty 1

## 2017-12-09 MED ORDER — MIDAZOLAM HCL 2 MG/2ML IJ SOLN
INTRAMUSCULAR | Status: AC
  Administered 2017-12-09: 2 mg
  Filled 2017-12-09: qty 2

## 2017-12-09 MED ORDER — SODIUM CHLORIDE 0.9 % IJ SOLN
INTRAMUSCULAR | Status: DC | PRN
  Administered 2017-12-09: 20 mL via INTRAVENOUS

## 2017-12-09 MED ORDER — TRANEXAMIC ACID 1000 MG/10ML IV SOLN
1000.0000 mg | Freq: Once | INTRAVENOUS | Status: AC
  Administered 2017-12-09: 1000 mg via INTRAVENOUS
  Filled 2017-12-09: qty 10

## 2017-12-09 MED ORDER — CHLORTHALIDONE 25 MG PO TABS
12.5000 mg | ORAL_TABLET | Freq: Every day | ORAL | Status: DC
  Administered 2017-12-09 – 2017-12-10 (×2): 12.5 mg via ORAL
  Filled 2017-12-09 (×2): qty 0.5

## 2017-12-09 MED ORDER — BUPIVACAINE-EPINEPHRINE (PF) 0.25% -1:200000 IJ SOLN
INTRAMUSCULAR | Status: AC
  Filled 2017-12-09: qty 30

## 2017-12-09 MED ORDER — BUPIVACAINE IN DEXTROSE 0.75-8.25 % IT SOLN
INTRATHECAL | Status: DC | PRN
Start: 2017-12-09 — End: 2017-12-09
  Administered 2017-12-09: 13.5 mg via INTRATHECAL

## 2017-12-09 MED ORDER — PROPOFOL 500 MG/50ML IV EMUL
INTRAVENOUS | Status: DC | PRN
  Administered 2017-12-09: 75 ug/kg/min via INTRAVENOUS

## 2017-12-09 MED ORDER — BUPIVACAINE-EPINEPHRINE (PF) 0.25% -1:200000 IJ SOLN
INTRAMUSCULAR | Status: DC | PRN
  Administered 2017-12-09: 30 mL via PERINEURAL

## 2017-12-09 MED ORDER — MENTHOL 3 MG MT LOZG: 1.0000 | LOZENGE | OROMUCOSAL | Status: DC | PRN

## 2017-12-09 MED ORDER — LACTATED RINGERS IV SOLN
INTRAVENOUS | Status: DC
  Administered 2017-12-09: 08:00:00 via INTRAVENOUS

## 2017-12-09 MED ORDER — SODIUM CHLORIDE 0.9 % IR SOLN
Status: DC | PRN
  Administered 2017-12-09: 3000 mL

## 2017-12-09 MED ORDER — ALUM & MAG HYDROXIDE-SIMETH 200-200-20 MG/5ML PO SUSP: 30.0000 mL | ORAL | Status: DC | PRN

## 2017-12-09 MED ORDER — DIPHENHYDRAMINE HCL 12.5 MG/5ML PO ELIX: 12.5000 mg | ORAL_SOLUTION | ORAL | Status: DC | PRN

## 2017-12-09 MED ORDER — 0.9 % SODIUM CHLORIDE (POUR BTL) OPTIME
TOPICAL | Status: DC | PRN
  Administered 2017-12-09: 1000 mL

## 2017-12-09 MED ORDER — ACETAMINOPHEN 500 MG PO TABS
1000.0000 mg | ORAL_TABLET | Freq: Once | ORAL | Status: AC
  Administered 2017-12-09: 1000 mg via ORAL
  Filled 2017-12-09: qty 2

## 2017-12-09 MED ORDER — METOCLOPRAMIDE HCL 5 MG/ML IJ SOLN: 5.0000 mg | Freq: Three times a day (TID) | INTRAMUSCULAR | Status: DC | PRN

## 2017-12-09 MED ORDER — GABAPENTIN 300 MG PO CAPS
300.0000 mg | ORAL_CAPSULE | Freq: Once | ORAL | Status: AC
  Administered 2017-12-09: 300 mg via ORAL
  Filled 2017-12-09: qty 1

## 2017-12-09 MED ORDER — METHOCARBAMOL 1000 MG/10ML IJ SOLN
500.0000 mg | Freq: Four times a day (QID) | INTRAMUSCULAR | Status: DC | PRN
  Filled 2017-12-09: qty 5

## 2017-12-09 MED ORDER — OXYCODONE HCL 5 MG PO TABS
10.0000 mg | ORAL_TABLET | ORAL | Status: DC | PRN
  Administered 2017-12-09: 15 mg via ORAL
  Administered 2017-12-09 – 2017-12-10 (×2): 10 mg via ORAL
  Administered 2017-12-10: 15 mg via ORAL
  Administered 2017-12-10: 10 mg via ORAL
  Filled 2017-12-09: qty 2
  Filled 2017-12-09: qty 3
  Filled 2017-12-09 (×2): qty 2
  Filled 2017-12-09: qty 3

## 2017-12-09 MED ORDER — ATENOLOL 12.5 MG HALF TABLET
12.5000 mg | ORAL_TABLET | Freq: Every day | ORAL | Status: DC
  Administered 2017-12-10: 12.5 mg via ORAL
  Filled 2017-12-09: qty 1

## 2017-12-09 MED ORDER — PANTOPRAZOLE SODIUM 40 MG PO TBEC: 40.0000 mg | DELAYED_RELEASE_TABLET | Freq: Every day | ORAL | Status: DC

## 2017-12-09 MED ORDER — MIDAZOLAM HCL 2 MG/2ML IJ SOLN: 2.0000 mg | Freq: Once | INTRAMUSCULAR | Status: DC

## 2017-12-09 MED ORDER — METHOCARBAMOL 500 MG PO TABS
500.0000 mg | ORAL_TABLET | Freq: Four times a day (QID) | ORAL | Status: DC | PRN
  Administered 2017-12-09 – 2017-12-10 (×4): 500 mg via ORAL
  Filled 2017-12-09 (×4): qty 1

## 2017-12-09 MED ORDER — SENNOSIDES-DOCUSATE SODIUM 8.6-50 MG PO TABS: 1.0000 | ORAL_TABLET | Freq: Every evening | ORAL | Status: DC | PRN

## 2017-12-09 MED ORDER — FENTANYL CITRATE (PF) 100 MCG/2ML IJ SOLN: 50.0000 ug | Freq: Once | INTRAMUSCULAR | Status: DC

## 2017-12-09 MED ORDER — ROPIVACAINE HCL 7.5 MG/ML IJ SOLN
INTRAMUSCULAR | Status: DC | PRN
  Administered 2017-12-09: 20 mL via PERINEURAL

## 2017-12-09 MED ORDER — ALPRAZOLAM 0.5 MG PO TABS
0.5000 mg | ORAL_TABLET | Freq: Every evening | ORAL | Status: DC | PRN
  Administered 2017-12-09: 0.5 mg via ORAL
  Filled 2017-12-09: qty 1

## 2017-12-09 MED ORDER — BISACODYL 5 MG PO TBEC: 5.0000 mg | DELAYED_RELEASE_TABLET | Freq: Every day | ORAL | Status: DC | PRN

## 2017-12-09 MED ORDER — ACETAMINOPHEN 500 MG PO TABS
1000.0000 mg | ORAL_TABLET | Freq: Four times a day (QID) | ORAL | Status: AC
  Administered 2017-12-09 – 2017-12-10 (×4): 1000 mg via ORAL
  Filled 2017-12-09 (×4): qty 2

## 2017-12-09 MED ORDER — CEFAZOLIN SODIUM-DEXTROSE 2-4 GM/100ML-% IV SOLN
2.0000 g | INTRAVENOUS | Status: AC
  Administered 2017-12-09: 2 g via INTRAVENOUS
  Filled 2017-12-09: qty 100

## 2017-12-09 MED ORDER — HYDROMORPHONE HCL 1 MG/ML IJ SOLN: 0.2500 mg | INTRAMUSCULAR | Status: DC | PRN

## 2017-12-09 MED ORDER — MIDAZOLAM HCL 2 MG/2ML IJ SOLN
INTRAMUSCULAR | Status: AC
  Filled 2017-12-09: qty 2

## 2017-12-09 MED ORDER — ONDANSETRON HCL 4 MG/2ML IJ SOLN
4.0000 mg | Freq: Four times a day (QID) | INTRAMUSCULAR | Status: DC | PRN
  Administered 2017-12-09 – 2017-12-10 (×3): 4 mg via INTRAVENOUS
  Filled 2017-12-09 (×3): qty 2

## 2017-12-09 MED ORDER — ONDANSETRON HCL 4 MG/2ML IJ SOLN
INTRAMUSCULAR | Status: DC | PRN
  Administered 2017-12-09: 4 mg via INTRAVENOUS

## 2017-12-09 MED ORDER — METFORMIN HCL ER 500 MG PO TB24
500.0000 mg | ORAL_TABLET | Freq: Every day | ORAL | Status: DC
  Filled 2017-12-09: qty 1

## 2017-12-09 MED ORDER — HYDROMORPHONE HCL 1 MG/ML IJ SOLN
0.5000 mg | INTRAMUSCULAR | Status: DC | PRN
  Administered 2017-12-09 – 2017-12-10 (×3): 1 mg via INTRAVENOUS
  Filled 2017-12-09 (×3): qty 1

## 2017-12-09 MED ORDER — ZOLPIDEM TARTRATE 5 MG PO TABS: 5.0000 mg | ORAL_TABLET | Freq: Every evening | ORAL | Status: DC | PRN

## 2017-12-09 MED ORDER — METOCLOPRAMIDE HCL 5 MG PO TABS: 5.0000 mg | ORAL_TABLET | Freq: Three times a day (TID) | ORAL | Status: DC | PRN

## 2017-12-09 MED ORDER — LISINOPRIL 5 MG PO TABS
5.0000 mg | ORAL_TABLET | Freq: Every day | ORAL | Status: DC
  Administered 2017-12-09 – 2017-12-10 (×2): 5 mg via ORAL
  Filled 2017-12-09 (×2): qty 1

## 2017-12-09 MED ORDER — LACTATED RINGERS IV SOLN
INTRAVENOUS | Status: DC | PRN
  Administered 2017-12-09 (×2): via INTRAVENOUS

## 2017-12-09 MED ORDER — PHENOL 1.4 % MT LIQD
1.0000 | OROMUCOSAL | Status: DC | PRN
Start: 2017-12-09 — End: 2017-12-10

## 2017-12-09 MED ORDER — FENTANYL CITRATE (PF) 250 MCG/5ML IJ SOLN
INTRAMUSCULAR | Status: AC
  Filled 2017-12-09: qty 5

## 2017-12-09 MED ORDER — CEFAZOLIN SODIUM-DEXTROSE 2-4 GM/100ML-% IV SOLN
2.0000 g | Freq: Four times a day (QID) | INTRAVENOUS | Status: AC
  Administered 2017-12-09 – 2017-12-10 (×2): 2 g via INTRAVENOUS
  Filled 2017-12-09 (×2): qty 100

## 2017-12-09 MED ORDER — FLEET ENEMA 7-19 GM/118ML RE ENEM: 1.0000 | ENEMA | Freq: Once | RECTAL | Status: DC | PRN

## 2017-12-09 MED ORDER — ONDANSETRON HCL 4 MG PO TABS
4.0000 mg | ORAL_TABLET | Freq: Four times a day (QID) | ORAL | Status: DC | PRN
  Administered 2017-12-10: 4 mg via ORAL
  Filled 2017-12-09: qty 1

## 2017-12-09 SURGICAL SUPPLY — 71 items
ARTISURF 10M PLY L 6-9EF KNEE (Knees) ×1 IMPLANT
BANDAGE ACE 6X5 VEL STRL LF (GAUZE/BANDAGES/DRESSINGS) ×2 IMPLANT
BANDAGE ESMARK 6X9 LF (GAUZE/BANDAGES/DRESSINGS) ×1 IMPLANT
BLADE SAGITTAL 13X1.27X60 (BLADE) ×2 IMPLANT
BLADE SAW SGTL 83.5X18.5 (BLADE) ×2 IMPLANT
BLADE SURG 10 STRL SS (BLADE) ×2 IMPLANT
BNDG CMPR 9X6 STRL LF SNTH (GAUZE/BANDAGES/DRESSINGS) ×1
BNDG ESMARK 6X9 LF (GAUZE/BANDAGES/DRESSINGS) ×2
BOWL SMART MIX CTS (DISPOSABLE) ×2 IMPLANT
BSPLAT TIB 5D E CMNT STM LT (Knees) ×1 IMPLANT
CATH FOLEY 3WAY  5CC 18FR (CATHETERS) ×1
CATH FOLEY 3WAY 5CC 18FR (CATHETERS) IMPLANT
CATH FOLEY LATEX FREE 12 FR (CATHETERS) ×2
CATH FOLEY LF 12 FR (CATHETERS) IMPLANT
CEMENT BONE SIMPLEX SPEEDSET (Cement) ×4 IMPLANT
COVER SURGICAL LIGHT HANDLE (MISCELLANEOUS) ×2 IMPLANT
CUFF TOURNIQUET SINGLE 34IN LL (TOURNIQUET CUFF) ×2 IMPLANT
DRAPE EXTREMITY T 121X128X90 (DRAPE) ×2 IMPLANT
DRAPE HALF SHEET 40X57 (DRAPES) ×2 IMPLANT
DRAPE INCISE IOBAN 66X45 STRL (DRAPES) ×4 IMPLANT
DRAPE U-SHAPE 47X51 STRL (DRAPES) ×2 IMPLANT
DRAPE UNIVERSAL PACK (DRAPES) ×1 IMPLANT
DRSG AQUACEL AG ADV 3.5X10 (GAUZE/BANDAGES/DRESSINGS) ×2 IMPLANT
DURAPREP 26ML APPLICATOR (WOUND CARE) ×4 IMPLANT
ELECT REM PT RETURN 9FT ADLT (ELECTROSURGICAL) ×2
ELECTRODE REM PT RTRN 9FT ADLT (ELECTROSURGICAL) ×1 IMPLANT
FEMUR  CMT CCR STD SZ8 L KNEE (Knees) ×1 IMPLANT
FEMUR CMT CCR STD SZ8 L KNEE (Knees) ×1 IMPLANT
FEMUR CMTD CCR STD SZ8 L KNEE (Knees) IMPLANT
GLOVE BIOGEL M 7.0 STRL (GLOVE) ×1 IMPLANT
GLOVE BIOGEL PI IND STRL 7.5 (GLOVE) IMPLANT
GLOVE BIOGEL PI IND STRL 8.5 (GLOVE) ×1 IMPLANT
GLOVE BIOGEL PI INDICATOR 7.5 (GLOVE) ×1
GLOVE BIOGEL PI INDICATOR 8.5 (GLOVE) ×1
GLOVE SURG ORTHO 8.0 STRL STRW (GLOVE) ×4 IMPLANT
GOWN STRL REUS W/ TWL LRG LVL3 (GOWN DISPOSABLE) ×1 IMPLANT
GOWN STRL REUS W/ TWL XL LVL3 (GOWN DISPOSABLE) ×2 IMPLANT
GOWN STRL REUS W/TWL 2XL LVL3 (GOWN DISPOSABLE) ×2 IMPLANT
GOWN STRL REUS W/TWL LRG LVL3 (GOWN DISPOSABLE) ×2
GOWN STRL REUS W/TWL XL LVL3 (GOWN DISPOSABLE) ×4
HANDPIECE INTERPULSE COAX TIP (DISPOSABLE) ×2
HOOD PEEL AWAY FACE SHEILD DIS (HOOD) ×6 IMPLANT
KIT BASIN OR (CUSTOM PROCEDURE TRAY) ×2 IMPLANT
KIT ROOM TURNOVER OR (KITS) ×2 IMPLANT
MANIFOLD NEPTUNE II (INSTRUMENTS) ×2 IMPLANT
NDL 18GX1X1/2 (RX/OR ONLY) (NEEDLE) IMPLANT
NEEDLE 18GX1X1/2 (RX/OR ONLY) (NEEDLE) IMPLANT
NEEDLE 22X1 1/2 (OR ONLY) (NEEDLE) ×4 IMPLANT
NS IRRIG 1000ML POUR BTL (IV SOLUTION) ×2 IMPLANT
PACK TOTAL JOINT (CUSTOM PROCEDURE TRAY) ×2 IMPLANT
PAD ARMBOARD 7.5X6 YLW CONV (MISCELLANEOUS) ×4 IMPLANT
SET HNDPC FAN SPRY TIP SCT (DISPOSABLE) ×1 IMPLANT
STEM POLY PAT PLY 35M KNEE (Knees) ×1 IMPLANT
STEM TIBIA 5 DEG SZ E L KNEE (Knees) IMPLANT
STRIP CLOSURE SKIN 1/2X4 (GAUZE/BANDAGES/DRESSINGS) ×2 IMPLANT
SUCTION FRAZIER HANDLE 10FR (MISCELLANEOUS)
SUCTION TUBE FRAZIER 10FR DISP (MISCELLANEOUS) IMPLANT
SUT BONE WAX W31G (SUTURE) ×2 IMPLANT
SUT MNCRL AB 3-0 PS2 18 (SUTURE) ×2 IMPLANT
SUT VIC AB 0 CTB1 27 (SUTURE) ×2 IMPLANT
SUT VIC AB 1 CT1 27 (SUTURE) ×4
SUT VIC AB 1 CT1 27XBRD ANBCTR (SUTURE) ×2 IMPLANT
SUT VIC AB 2-0 CT1 27 (SUTURE) ×4
SUT VIC AB 2-0 CT1 TAPERPNT 27 (SUTURE) ×2 IMPLANT
SUT VLOC 180 0 24IN GS25 (SUTURE) ×2 IMPLANT
SYR 20CC LL (SYRINGE) ×4 IMPLANT
TIBIA STEM 5 DEG SZ E L KNEE (Knees) ×2 IMPLANT
TOWEL OR 17X24 6PK STRL BLUE (TOWEL DISPOSABLE) ×2 IMPLANT
TOWEL OR 17X26 10 PK STRL BLUE (TOWEL DISPOSABLE) ×2 IMPLANT
TRAY CATH 16FR W/PLASTIC CATH (SET/KITS/TRAYS/PACK) IMPLANT
WRAP KNEE MAXI GEL POST OP (GAUZE/BANDAGES/DRESSINGS) ×2 IMPLANT

## 2017-12-09 NOTE — Anesthesia Postprocedure Evaluation (Signed)
Anesthesia Post Note  [facility]  Procedure(s) Performed: LEFT TOTAL KNEE ARTHROPLASTY (Left Knee)     Patient location during evaluation: PACU Anesthesia Type: Spinal and Regional Level of consciousness: oriented and awake and alert Pain management: pain level controlled Vital Signs Assessment: post-procedure vital signs reviewed and stable Respiratory status: spontaneous breathing, respiratory function stable and patient connected to nasal cannula oxygen Cardiovascular status: blood pressure returned to baseline and stable Postop Assessment: no headache, no backache and no apparent nausea or vomiting Anesthetic complications: no    Last Vitals:  Vitals:   12/09/17 1422 12/09/17 1437  BP: 136/76 139/79  Pulse: (!) 57 (!) 58  Resp: 15 (!) 21  Temp:  (!) 36.4 C  SpO2: 100% 100%    Last Pain:  Vitals:   12/09/17 1420  TempSrc:   PainSc: 0-No pain                 Marlo Goodrich,W. EDMOND

## 2017-12-09 NOTE — Progress Notes (Signed)
Orthopedic Tech Progress Note Patient Details:  Brad Fischer 25-Nov-1950 [id number]  CPM Left Knee CPM Left Knee: On Left Knee Flexion (Degrees): 90 Left Knee Extension (Degrees): 0 Additional Comments: foot roll  Post Interventions Patient Tolerated: Well Instructions Provided: Care of device, Adjustment of device  Saul FordyceJennifer C Tayler Heiden 12/09/2017, 1:54 PM

## 2017-12-09 NOTE — Evaluation (Signed)
Physical Therapy Evaluation Patient Details Name: Brad Fischer MRN: [id number] DOB: Dec 10, 1950 Today's Date: 12/09/2017   History of Present Illness  67 y.o. male s/p L TKA. PMH includes: Seizures, PONV, GERD, Anxiety, Lumbar Spinal Fusion, cardiac catherization, bladder surgery, ankle surgery, Cervical fusion.     Clinical Impression  Patient is s/p above surgery resulting in functional limitations due to the deficits listed below (see PT Problem List). PTA pt independent with mobility, active lifestyle with wife in 1 story home. Upon eval pt presents with post op pain and weakness. Currently min guard level for mobility, and able to progress to supervision with hallway ambulation. Anticpiate patient will do well with therapy tomorrow and be ready to return home safely once medically cleared for d/c.  Patient will benefit from skilled PT to increase their independence and safety with mobility to allow discharge to the venue listed below.       Follow Up Recommendations Follow surgeon's recommendation for DC plan and follow-up therapies;Supervision for mobility/OOB    Equipment Recommendations  None recommended by PT    Recommendations for Other Services       Precautions / Restrictions Precautions Precautions: Fall;Knee Precaution Booklet Issued: Yes (comment) Precaution Comments: reviewed supine therex and no pillow under knee Restrictions Weight Bearing Restrictions: Yes LLE Weight Bearing: Weight bearing as tolerated      Mobility  Bed Mobility Overal bed mobility: Modified Independent                Transfers Overall transfer level: Needs assistance   Transfers: Sit to/from Stand Sit to Stand: Min guard         General transfer comment: min guard for safety, cues for use of RW and hand placement  Ambulation/Gait Ambulation/Gait assistance: Min guard;Supervision Ambulation Distance (Feet): 100 Feet Assistive device: Rolling walker (2 wheeled) Gait  Pattern/deviations: Step-to pattern;Step-through pattern;Antalgic Gait velocity: decreased   General Gait Details: antalgic gait, cues for heel strike, step length, use of RW. pt able to progress to supervision and demo intermittant step through  [employer]            Wheelchair Mobility    Modified Rankin (Stroke Patients Only)       Balance Overall balance assessment: Needs assistance   Sitting balance-Leahy Scale: Normal       Standing balance-Leahy Scale: Fair Standing balance comment: RW for dynamic mobility due to pain                             Pertinent Vitals/Pain Pain Assessment: 0-10 Pain Score: 8  Pain Location: L knee Pain Descriptors / Indicators: Contraction;Discomfort;Grimacing;Operative site guarding Pain Intervention(s): Limited activity within patient's tolerance;Monitored during session;Premedicated before session;Repositioned    Home Living Family/patient expects to be discharged to:: Private residence Living Arrangements: Spouse/significant other Available Help at Discharge: Family;Available 24 hours/day Type of Home: House Home Access: Stairs to enter Entrance Stairs-Rails: None Entrance Stairs-Number of Steps: 1 platform  Home Layout: One level Home Equipment: Environmental consultantWalker - 2 wheels;Bedside commode      Prior Function Level of Independence: Independent               Hand Dominance        Extremity/Trunk Assessment   Upper Extremity Assessment Upper Extremity Assessment: Overall WFL for tasks assessed    Lower Extremity Assessment Lower Extremity Assessment: LLE deficits/detail(RLE strength WNL) LLE Deficits / Details: pain and weakness consistent with above procedure  Cervical / Trunk Assessment Cervical / Trunk Assessment: Normal;Other exceptions(lumbar and cervical surgeries )  Communication   Communication: No difficulties  Cognition Arousal/Alertness: Awake/alert Behavior During Therapy: WFL for tasks  assessed/performed Overall Cognitive Status: Within Functional Limits for tasks assessed                                        General Comments General comments (skin integrity, edema, etc.): Discussed home safety consdierations and progression of exercises with family and patient    Exercises Total Joint Exercises Ankle Circles/Pumps: 20 reps Quad Sets: 10 reps Heel Slides: 10 reps Goniometric ROM: 100* flexion    Assessment/Plan    PT Assessment Patient needs continued PT services  PT Problem List Decreased strength;Decreased range of motion;Decreased activity tolerance;Decreased balance;Decreased mobility;Pain       PT Treatment Interventions DME instruction;Gait training;Stair training;Functional mobility training;Therapeutic activities;Therapeutic exercise;Balance training    PT Goals (Current goals can be found in the Care Plan section)  Acute Rehab PT Goals Patient Stated Goal: return home tomorrow with HHPT PT Goal Formulation: With patient Time For Goal Achievement: 12/16/17 Potential to Achieve Goals: Good    Frequency 7X/week   Barriers to discharge        Co-evaluation               AM-PAC PT "6 Clicks" Daily Activity  Outcome Measure Difficulty turning over in bed (including adjusting bedclothes, sheets and blankets)?: A Little Difficulty moving from lying on back to sitting on the side of the bed? : A Little Difficulty sitting down on and standing up from a chair with arms (e.g., wheelchair, bedside commode, etc,.)?: A Little Help needed moving to and from a bed to chair (including a wheelchair)?: A Little Help needed walking in hospital room?: A Little Help needed climbing 3-5 steps with a railing? : A Little 6 Click Score: 18    End of Session Equipment Utilized During Treatment: Gait belt Activity Tolerance: Patient tolerated treatment well Patient left: in chair;with call bell/phone within reach;with nursing/sitter in  room;with family/visitor present(in knee extension foam ) Nurse Communication: Mobility status PT Visit Diagnosis: Unsteadiness on feet (R26.81);Other abnormalities of gait and mobility (R26.89);Pain;Difficulty in walking, not elsewhere classified (R26.2) Pain - Right/Left: Left Pain - part of body: Knee    Time: 1645-1730 PT Time Calculation (min) (ACUTE ONLY): 45 min   Charges:   PT Evaluation $PT Eval Low Complexity: 1 Low PT Treatments $Gait Training: 8-22 mins $Therapeutic Exercise: 8-22 mins   PT G Codes:        Etta Grandchild, PT, DPT Acute Rehab Services Pager: [phone]   Etta Grandchild 12/09/2017, 5:29 PM

## 2017-12-09 NOTE — H&P (Signed)
Brad Fischer MRN:  [id number] DOB/SEX:  Oct 16, 1950/male  CHIEF COMPLAINT:  Painful left Knee  HISTORY: Patient is a 67 y.o. male presented with a history of pain in the left knee. Onset of symptoms was gradual starting a few years ago with gradually worsening course since that time. Patient has been treated conservatively with over-the-counter NSAIDs and activity modification. Patient currently rates pain in the knee at 10 out of 10 with activity. There is pain at night.  PAST MEDICAL HISTORY: Patient Active Problem List   Diagnosis Date Noted  . GERD 10/10/2010   Past Medical History:  Diagnosis Date  . Allergy    egg whites  . Anemia   . Anxiety   . Arthritis   . Barrett's esophagus   . Cataract   . Chronic headaches   . Colon polyps   . Complication of anesthesia    pt sts he woke up in the middle of the procedure in 1987  . Depression   . Diverticulosis   . Elevated cholesterol   . GERD (gastroesophageal reflux disease)   . Glaucoma   . History of intestinal parasite   . Hypertension   . Interstitial cystitis   . Osteoarthritis of right knee    severe  . PONV (postoperative nausea and vomiting)   . Pre-diabetes    Takes Metformin daily  . Seizures (HCC) 10/09/2011   In ER from severe headache  . Sleep apnea    Does not use CPAP  . UTI (lower urinary tract infection)    Past Surgical History:  Procedure Laterality Date  . ANKLE SURGERY     right  . APPENDECTOMY    . BLADDER SURGERY  12/2013   [facility], [place]  . BLADDER SURGERY  05/2014  . CARDIAC CATHETERIZATION    . Cataract removed Right 10/18/13  . CERVICAL FUSION  2008  . CERVICAL FUSION  11/04/2017  . Cyst removed     Right middle finger  . eye lid surgery    . EYE SURGERY    . HEMORROIDECTOMY    . JOINT REPLACEMENT    . KNEE SURGERY     left  . NOSE SURGERY  2013  . Prostate removed  12/2013   [facility], [place]  . SINOSCOPY    . spinal fusion  2007   lower  . TONSILLECTOMY        MEDICATIONS:   Medications Prior to Admission  Medication Sig Dispense Refill Last Dose  . ALPRAZolam (XANAX) 0.5 MG tablet Take 0.5 mg by mouth at bedtime as needed.   12/08/2017 at Unknown time  . atenolol (TENORMIN) 25 MG tablet Take 12.5 mg by mouth daily.  11 12/09/2017 at 0545  . calcium citrate-vitamin D (CITRACAL+D) 315-200 MG-UNIT tablet Take 1 tablet by mouth daily.    Past Week at Unknown time  . chlorthalidone (HYGROTON) 25 MG tablet Take 12.5 mg by mouth daily.   11 12/08/2017 at Unknown time  . cyclobenzaprine (FLEXERIL) 10 MG tablet Take 10 mg by mouth 3 (three) times daily as needed for muscle spasms.    Taking  . eletriptan (RELPAX) 40 MG tablet Take 40 mg by mouth as needed for migraine. may repeat in 2 hours if necessary    12/08/2017 at Unknown time  . HYDROcodone-acetaminophen (NORCO/VICODIN) 5-325 MG per tablet Take 1 tablet by mouth every 6 (six) hours as needed for moderate pain (Pt takes 0.5 tablet twice daily).    Past Week at Unknown  time  . lisinopril (PRINIVIL,ZESTRIL) 10 MG tablet Take 5 mg by mouth daily.    12/08/2017 at Unknown time  . metFORMIN (GLUCOPHAGE-XR) 500 MG 24 hr tablet Take 500 mg by mouth daily with breakfast.   6 Past Month at Unknown time  . omeprazole (PRILOSEC) 20 MG capsule Take 20 mg by mouth every morning.   12/09/2017 at 0545  . psyllium (METAMUCIL) 58.6 % powder Take 1 packet by mouth daily as needed (once or twice weekly as needed).   Past Week at Unknown time  . EPINEPHrine 0.3 mg/0.3 mL IJ SOAJ injection See admin instructions.  99 More than a month at Unknown time    ALLERGIES:   Allergies  Allergen Reactions  . Dutasteride Anaphylaxis    *Avodart*   . Sulfamethoxazole Itching  . Codeine Itching  . Sulfa Antibiotics Itching and Other (See Comments)    headache  . Sulfamethoxazole-Trimethoprim Other (See Comments)    Severe headaches    REVIEW OF SYSTEMS:  A comprehensive review of systems was negative except for:  Musculoskeletal: positive for arthralgias and bone pain   FAMILY HISTORY:   Family History  Problem Relation Age of Onset  . Colon cancer Paternal Grandfather [id number]  . Lung cancer Father   . Esophageal cancer Father   . Prostate cancer Father   . Breast cancer Mother   . COPD Mother   . Heart disease Mother   . Heart disease Maternal Grandfather   . Heart disease Maternal Grandmother     SOCIAL HISTORY:   Social History   Tobacco Use  . Smoking status: Former Smoker    Last attempt to quit: 09/25/2007    Years since quitting: 10.2  . Smokeless tobacco: Never Used  Substance Use Topics  . Alcohol use: No     EXAMINATION:  Vital signs in last 24 hours: Temp:  [98.2 F (36.8 C)] 98.2 F (36.8 C) (03/18 0803) Pulse Rate:  [65] 65 (03/18 0803) Resp:  [20] 20 (03/18 0803) BP: (144)/(87) 144/87 (03/18 0803) SpO2:  [100 %] 100 % (03/18 0803)  BP (!) 144/87   Pulse 65   Temp 98.2 F (36.8 C) (Oral)   Resp 20   SpO2 100%   General Appearance:    Alert, cooperative, no distress, appears stated age  Head:    Normocephalic, without obvious abnormality, atraumatic  Eyes:    PERRL, conjunctiva/corneas clear, EOM's intact, fundi    benign, both eyes       Ears:    Normal TM's and external ear canals, both ears  Nose:   Nares normal, septum midline, mucosa normal, no drainage    or sinus tenderness  Throat:   Lips, mucosa, and tongue normal; teeth and gums normal  Neck:   Supple, symmetrical, trachea midline, no adenopathy;       thyroid:  No enlargement/tenderness/nodules; no carotid   bruit or JVD  Back:     Symmetric, no curvature, ROM normal, no CVA tenderness  Lungs:     Clear to auscultation bilaterally, respirations unlabored  Chest wall:    No tenderness or deformity  Heart:    Regular rate and rhythm, S1 and S2 normal, no murmur, rub   or gallop  Abdomen:     Soft, non-tender, bowel sounds active all four quadrants,    no masses, no organomegaly  Genitalia:    Normal  male without lesion, discharge or tenderness  Rectal:    Normal tone, normal prostate, no masses  or tenderness;   guaiac negative stool  Extremities:   Extremities normal, atraumatic, no cyanosis or edema  Pulses:   2+ and symmetric all extremities  Skin:   Skin color, texture, turgor normal, no rashes or lesions  Lymph nodes:   Cervical, supraclavicular, and axillary nodes normal  Neurologic:   CNII-XII intact. Normal strength, sensation and reflexes      throughout    Musculoskeletal:  ROM 0-120, Ligaments intact,  Imaging Review Plain radiographs demonstrate severe degenerative joint disease of the left knee. The overall alignment is neutral. The bone quality appears to be good for age and reported activity level.  Assessment/Plan: Primary osteoarthritis, left knee   The patient history, physical examination and imaging studies are consistent with advanced degenerative joint disease of the left knee. The patient has failed conservative treatment.  The clearance notes were reviewed.  After discussion with the patient it was felt that Total Knee Replacement was indicated. The procedure,  risks, and benefits of total knee arthroplasty were presented and reviewed. The risks including but not limited to aseptic loosening, infection, blood clots, vascular injury, stiffness, patella tracking problems complications among others were discussed. The patient acknowledged the explanation, agreed to proceed with the plan.  Guy Sandifer 12/09/2017, 9:18 AM

## 2017-12-09 NOTE — Anesthesia Procedure Notes (Signed)
Anesthesia Regional Block: Adductor canal block   Pre-Anesthetic Checklist: ,, timeout performed, Correct Patient, Correct Site, Correct Laterality, Correct Procedure, Correct Position, site marked, Risks and benefits discussed, pre-op evaluation,  At surgeon's request and post-op pain management  Laterality: Left  Prep: Maximum Sterile Barrier Precautions used, chloraprep       Needles:  Injection technique: Single-shot  Needle Type: Echogenic Stimulator Needle     Needle Length: 9cm  Needle Gauge: 21     Additional Needles:   Procedures:,,,, ultrasound used (permanent image in chart),,,,  Narrative:  Start time: 12/09/2017 9:51 AM End time: 12/09/2017 10:01 AM Injection made incrementally with aspirations every 5 mL.  Performed by: Personally  Anesthesiologist: Gaynelle AduFitzgerald, Brodric, MD  Additional Notes: 2% Lidocaine skin wheel.

## 2017-12-09 NOTE — Transfer of Care (Signed)
Immediate Anesthesia Transfer of Care Note  Patient: Brad Fischer [facility]  Procedure(s) Performed: LEFT TOTAL KNEE ARTHROPLASTY (Left Knee)  Patient Location: PACU  Anesthesia Type:MAC and Spinal  Level of Consciousness: awake, alert , oriented, patient cooperative and responds to stimulation  Airway & Oxygen Therapy: Patient Spontanous Breathing  Post-op Assessment: Report given to RN and Post -op Vital signs reviewed and stable  Post vital signs: Reviewed and stable  Last Vitals:  Vitals:   12/09/17 1015 12/09/17 1253  BP: 135/82   Pulse: 65   Resp: 16   Temp:  (!) (P) 36.3 C  SpO2: 99%     Last Pain:  Vitals:   12/09/17 0825  TempSrc:   PainSc: 3       Patients Stated Pain Goal: 3 (60/73/71 0626)  Complications: No apparent anesthesia complications

## 2017-12-09 NOTE — Anesthesia Procedure Notes (Signed)
Spinal  Patient location during procedure: OR Start time: 12/09/2017 10:59 AM End time: 12/09/2017 11:03 AM Staffing Anesthesiologist: Gaynelle AduFitzgerald, Supreme, MD Performed: anesthesiologist  Preanesthetic Checklist Completed: patient identified, surgical consent, pre-op evaluation, timeout performed, IV checked, risks and benefits discussed and monitors and equipment checked Spinal Block Patient position: sitting Prep: DuraPrep Patient monitoring: cardiac monitor, continuous pulse ox and blood pressure Approach: left paramedian Location: L2-3 Injection technique: single-shot Needle Needle type: Pencan  Needle gauge: 24 G Needle length: 9 cm Assessment Sensory level: T8 Additional Notes Functioning IV was confirmed and monitors were applied. Sterile prep and drape, including hand hygiene and sterile gloves were used. The patient was positioned and the spine was prepped. The skin was anesthetized with lidocaine.  Free flow of clear CSF was obtained prior to injecting local anesthetic into the CSF.  The spinal needle aspirated freely following injection.  The needle was carefully withdrawn.  The patient tolerated the procedure well.

## 2017-12-10 ENCOUNTER — Encounter (HOSPITAL_COMMUNITY): Payer: Self-pay | Admitting: Orthopedic Surgery

## 2017-12-10 DIAGNOSIS — M170 Bilateral primary osteoarthritis of knee: Secondary | ICD-10-CM | POA: Diagnosis not present

## 2017-12-10 LAB — BASIC METABOLIC PANEL
Anion gap: 10 (ref 5–15)
BUN: 9 mg/dL (ref 6–20)
CALCIUM: 8.3 mg/dL — AB (ref 8.9–10.3)
CO2: 26 mmol/L (ref 22–32)
CREATININE: 0.83 mg/dL (ref 0.61–1.24)
Chloride: 96 mmol/L — ABNORMAL LOW (ref 101–111)
GFR calc Af Amer: >60 mL/min (ref >60)
GLUCOSE: 153 mg/dL — AB (ref 65–99)
POTASSIUM: 2.9 mmol/L — AB (ref 3.5–5.1)
SODIUM: 132 mmol/L — AB (ref 135–145)

## 2017-12-10 LAB — CBC
HCT: 40.6 % (ref 39.0–52.0)
Hemoglobin: 13.2 g/dL (ref 13.0–17.0)
MCH: 29.9 pg (ref 26.0–34.0)
MCHC: 32.5 g/dL (ref 30.0–36.0)
MCV: 92.1 fL (ref 78.0–100.0)
PLATELETS: 144 10*3/uL — AB (ref 150–400)
RBC: 4.41 MIL/uL (ref 4.22–5.81)
RDW: 13.7 % (ref 11.5–15.5)
WBC: 6.9 10*3/uL (ref 4.0–10.5)

## 2017-12-10 MED ORDER — ASPIRIN 325 MG PO TBEC: 325.0000 mg | DELAYED_RELEASE_TABLET | Freq: Two times a day (BID) | ORAL | 0 refills | Status: DC

## 2017-12-10 MED ORDER — ONDANSETRON HCL 4 MG PO TABS: 4.0000 mg | ORAL_TABLET | Freq: Four times a day (QID) | ORAL | 0 refills | Status: AC | PRN

## 2017-12-10 MED ORDER — OXYCODONE HCL 10 MG PO TABS: 10.0000 mg | ORAL_TABLET | ORAL | 0 refills | Status: DC | PRN

## 2017-12-10 NOTE — Progress Notes (Signed)
Removed IV, provided discharge instructions/education, all questions and concerns addressed, Pt not in distress, discharged home with belongings accompanied by wife.

## 2017-12-10 NOTE — Care Management Note (Signed)
Case Management Note  Patient Details  Name: Brad Fischer MRN: [id number] Date of Birth: [birth year]  Subjective/Objective:   67 yr old gentleman s/p left total knee arthroplasty.                 Action/Plan: Case manager spoe with patient and his wife concerning discharge plan and DME. Choice for Home Health Agency was offered, [facility]. Patient will have family support at discharge.    Expected Discharge Date:  12/10/17               Expected Discharge Plan:  Home w Home Health Services  In-House Referral:  NA  Discharge planning Services  CM Consult  Post Acute Care Choice:  Durable Medical Equipment, Home Health Choice offered to:  Patient, Spouse  DME Arranged:  3-N-1, CPM, Walker rolling DME Agency:  [employer]  HH Arranged:  PT HH Agency:  [facility]  Status of Service:  Completed, signed off  If discussed at Long Length of Stay Meetings, dates discussed:    Additional Comments:  Durenda GuthrieBrady, [name] Naomi, RN 12/10/2017, 2:47 PM

## 2017-12-10 NOTE — Progress Notes (Signed)
Physical Therapy Treatment Patient Details Name: Brad Fischer MRN: [id number] DOB: 11/18/1950 Today's Date: 12/10/2017    History of Present Illness 67 y.o. male s/p L TKA. PMH includes: Seizures, PONV, GERD, Anxiety, Lumbar Spinal Fusion, cardiac catherization, bladder surgery, ankle surgery, Cervical fusion.     PT Comments    Patient is progressing toward mobility goals and overall required supervision/min guard for OOB mobility. Pt limited by nausea. Continue to progress as tolerated.    Follow Up Recommendations  Follow surgeon's recommendation for DC plan and follow-up therapies;Supervision for mobility/OOB     Equipment Recommendations  None recommended by PT    Recommendations for Other Services       Precautions / Restrictions Precautions Precautions: Fall;Knee Precaution Comments: knee precautions reviewed with pt Restrictions Weight Bearing Restrictions: Yes LLE Weight Bearing: Weight bearing as tolerated    Mobility  Bed Mobility Overal bed mobility: Modified Independent             General bed mobility comments: increased time and effort  Transfers Overall transfer level: Needs assistance Equipment used: Rolling walker (2 wheeled) Transfers: Sit to/from Stand Sit to Stand: Min guard         General transfer comment: cues for safe hand placement  Ambulation/Gait Ambulation/Gait assistance: Supervision Ambulation Distance (Feet): 150 Feet Assistive device: Rolling walker (2 wheeled) Gait Pattern/deviations: Step-through pattern;Decreased stride length;Decreased weight shift to left;Antalgic Gait velocity: decreased   General Gait Details: cues for sequencing of gait with use of AD for smoother step through pattern, posture, and L hee strike   Stairs Stairs: Yes   Stair Management: No rails;Step to pattern;Forwards;With walker Number of Stairs: 1 General stair comments: cues for sequencing and technique simulating pt's home  entrance  Wheelchair Mobility    Modified Rankin (Stroke Patients Only)       Balance Overall balance assessment: Needs assistance   Sitting balance-Leahy Scale: Normal       Standing balance-Leahy Scale: Fair                              Cognition Arousal/Alertness: Awake/alert Behavior During Therapy: WFL for tasks assessed/performed Overall Cognitive Status: Within Functional Limits for tasks assessed                                        Exercises      General Comments        Pertinent Vitals/Pain Pain Assessment: Faces Faces Pain Scale: Hurts little more Pain Location: L knee Pain Descriptors / Indicators: Guarding;Sore Pain Intervention(s): Limited activity within patient's tolerance;Monitored during session;Premedicated before session;Repositioned;Ice applied    Home Living                      Prior Function            PT Goals (current goals can now be found in the care plan section) Acute Rehab PT Goals PT Goal Formulation: With patient Time For Goal Achievement: 12/16/17 Potential to Achieve Goals: Good Progress towards PT goals: Progressing toward goals    Frequency    7X/week      PT Plan Current plan remains appropriate    Co-evaluation              AM-PAC PT "6 Clicks" Daily Activity  Outcome Measure  Difficulty turning over in  bed (including adjusting bedclothes, sheets and blankets)?: A Little Difficulty moving from lying on back to sitting on the side of the bed? : A Little Difficulty sitting down on and standing up from a chair with arms (e.g., wheelchair, bedside commode, etc,.)?: A Little Help needed moving to and from a bed to chair (including a wheelchair)?: A Little Help needed walking in hospital room?: A Little Help needed climbing 3-5 steps with a railing? : A Little 6 Click Score: 18    End of Session Equipment Utilized During Treatment: Gait belt Activity Tolerance:  Patient tolerated treatment well Patient left: in chair;with call bell/phone within reach;with family/visitor present(in knee extension foam ) Nurse Communication: Mobility status PT Visit Diagnosis: Unsteadiness on feet (R26.81);Other abnormalities of gait and mobility (R26.89);Pain;Difficulty in walking, not elsewhere classified (R26.2) Pain - Right/Left: Left Pain - part of body: Knee     Time: 1610-9604 PT Time Calculation (min) (ACUTE ONLY): 35 min  Charges:  $Gait Training: 23-37 mins                    G Codes:       Erline Levine, PTA Pager: [phone]     Carolynne Edouard 12/10/2017, 9:38 AM

## 2017-12-10 NOTE — Care Management CC44 (Deleted)
Condition Code 44 Documentation Completed  Patient Details  Name: Oneita HurtWilliam H Delaluz MRN: [id number] Date of Birth: 1951/06/28   Condition Code 44 given:    Patient signature on Condition Code 44 notice:    Documentation of 2 MD's agreement:    Code 44 added to claim:       Durenda GuthrieBrady, Harsha Yusko Naomi, RN 12/10/2017, 2:43 PM

## 2017-12-10 NOTE — Progress Notes (Signed)
SPORTS MEDICINE AND JOINT REPLACEMENT  Georgena Spurling, MD    Laurier Nancy, PA-C [address] [place], [place], [place]  [address]                             [phone]   PROGRESS NOTE  Subjective:  negative for Chest Pain  negative for Shortness of Breath  negative for Nausea/Vomiting   negative for Calf Pain  negative for Bowel Movement   Tolerating Diet: yes         Patient reports pain as 3 on 0-10 scale.    Objective: Vital signs in last 24 hours:    Patient Vitals for the past 24 hrs:  BP Temp Temp src Pulse Resp SpO2  12/10/17 0441 130/75 98 F (36.7 C) Oral 79 17 99 %  12/10/17 0032 137/78 97.9 F (36.6 C) Oral 72 19 100 %  12/09/17 2000 (!) 145/80 98.4 F (36.9 C) Oral 63 20 100 %  12/09/17 1700 138/69 97.9 F (36.6 C) Oral 68 19 100 %  12/09/17 1451 134/80 98.6 F (37 C) Oral 60 20 100 %  12/09/17 1437 139/79 (!) 97.5 F (36.4 C) - (!) 58 (!) 21 100 %  12/09/17 1422 136/76 - - (!) 57 15 100 %  12/09/17 1407 132/77 - - (!) 52 17 100 %  12/09/17 1352 132/81 - - (!) 54 15 100 %  12/09/17 1337 124/76 - - (!) 51 17 100 %  12/09/17 1322 127/74 - - (!) 53 (!) 21 100 %  12/09/17 1307 122/71 - - (!) 52 12 100 %  12/09/17 1253 - (!) 97.4 F (36.3 C) - - - -  12/09/17 1252 109/71 - - (!) 59 (!) 22 99 %  12/09/17 1015 135/82 - - 65 16 99 %  12/09/17 1010 (!) 136/100 - - 64 14 99 %  12/09/17 1000 132/77 - - 60 10 100 %  12/09/17 0940 125/82 - - (!) 52 14 99 %  12/09/17 0935 130/82 - - (!) 59 16 100 %  12/09/17 0803 (!) 144/87 98.2 F (36.8 C) Oral 65 20 100 %    @flow {1959:LAST@   Intake/Output from previous day:   03/18 0701 - 03/19 0700 In: 2810.8 [P.O.:960; I.V.:1850.8] Out: 4185 [Urine:4175]   Intake/Output this shift:   No intake/output data recorded.   Intake/Output      03/18 0701 - 03/19 0700 03/19 0701 - 03/20 0700   P.O. 960    I.V. 1850.8    IV Piggyback 0    Total Intake 2810.8    Urine 4175    Blood 10    Total Output 4185    Net  -1374.2            LABORATORY DATA: No results for input(s): WBC, HGB, HCT, PLT in the last 168 hours. No results for input(s): NA, K, CL, CO2, BUN, CREATININE, GLUCOSE, CALCIUM in the last 168 hours. No results found for: INR, PROTIME  Examination:  General appearance: alert, cooperative and no distress Extremities: extremities normal, atraumatic, no cyanosis or edema  Wound Exam: clean, dry, intact   Drainage:  None: wound tissue dry  Motor Exam: Quadriceps and Hamstrings Intact  Sensory Exam: Superficial Peroneal, Deep Peroneal and Tibial normal   Assessment:    1 Day Post-Op  Procedure(s) (LRB): LEFT TOTAL KNEE ARTHROPLASTY (Left)  ADDITIONAL DIAGNOSIS:  Active Problems:   S/P total knee replacement  Plan: Physical Therapy as ordered Weight Bearing as Tolerated (WBAT)  DVT Prophylaxis:  Aspirin  DISCHARGE PLAN: Home  DISCHARGE NEEDS: HHPT   Patient progressing well, expected D/C home today         Guy SandiferColby Alan Otha Rickles 12/10/2017, 7:09 AM

## 2017-12-10 NOTE — Care Management Obs Status (Signed)
MEDICARE OBSERVATION STATUS NOTIFICATION   Patient Details  Name: Brad Fischer MRN: [id number] Date of Birth: May 27, 1951   Medicare Observation Status Notification Given:  Yes    Durenda GuthrieBrady, Jermane Brayboy Naomi, RN 12/10/2017, 2:45 PM

## 2017-12-10 NOTE — Plan of Care (Signed)
  Activity: Risk for activity intolerance will decrease 12/10/2017 1103 - Progressing by Darrow BussingArcilla, Amaria Mundorf M, RN   Nutrition: Adequate nutrition will be maintained 12/10/2017 1103 - Progressing by Darrow BussingArcilla, Zyriah Mask M, RN   Elimination: Will not experience complications related to bowel motility 12/10/2017 1103 - Progressing by Darrow BussingArcilla, Hulbert Branscome M, RN   Pain Managment: General experience of comfort will improve 12/10/2017 1103 - Progressing by Darrow BussingArcilla, Macenzie Burford M, RN   Safety: Ability to remain free from injury will improve 12/10/2017 1103 - Progressing by Darrow BussingArcilla, Banjamin Stovall M, RN

## 2017-12-10 NOTE — Op Note (Addendum)
TOTAL KNEE REPLACEMENT OPERATIVE NOTE:  12/09/2017  1:58 PM  PATIENT:  Brad Fischer  67 y.o. male  PRE-OPERATIVE DIAGNOSIS:  primary osteoarthritis left knee  POST-OPERATIVE DIAGNOSIS:  primary osteoarthritis left knee  PROCEDURE:  Procedure(s): LEFT TOTAL KNEE ARTHROPLASTY  SURGEON:  Surgeon(s): Dannielle Huh, MD  PHYSICIAN ASSISTANT: Raj Janus, PAC  ANESTHESIA:   spinal  DRAINS: Hemovac  SPECIMEN: None  COUNTS:  Correct  TOURNIQUET:   Total Tourniquet Time Documented: Thigh (laterality) - 31 minutes Total: Thigh (laterality) - 31 minutes   DICTATION:  Indication for procedure:    The patient is a 67 y.o. male who has failed conservative treatment for primary osteoarthritis left knee.  Informed consent was obtained prior to anesthesia. The risks versus benefits of the operation were explain and in a way the patient can, and did, understand.   On the implant demand matching protocol, this patient scored 10.  Therefore, this patient was not receive a polyethylene insert with vitamin E which is a high demand implant.  Description of procedure:     The patient was taken to the operating room and placed under anesthesia.  The patient was positioned in the usual fashion taking care that all body parts were adequately padded and/or protected.  I foley catheter was not placed.  A tourniquet was applied and the leg prepped and draped in the usual sterile fashion.  The extremity was exsanguinated with the esmarch and tourniquet inflated to 350 mmHg.  Pre-operative range of motion was normal.  The knee was in 6 degree of mild varus.  A midline incision approximately 6-7 inches long was made with a #10 blade.  A new blade was used to make a parapatellar arthrotomy going 2-3 cm into the quadriceps tendon, over the patella, and alongside the medial aspect of the patellar tendon.  A synovectomy was then performed with the #10 blade and forceps. I then elevated the deep MCL off  the medial tibial metaphysis subperiosteally around to the semimembranosus attachment.    I everted the patella and used calipers to measure patellar thickness.  I used the reamer to ream down to appropriate thickness to recreate the native thickness.  I then removed excess bone with the rongeur and sagittal saw.  I used the appropriately sized template and drilled the three lug holes.  I then put the trial in place and measured the thickness with the calipers to ensure recreation of the native thickness.  The trial was then removed and the patella subluxed and the knee brought into flexion.  A homan retractor was place to retract and protect the patella and lateral structures.  A Z-retractor was place medially to protect the medial structures.  The extra-medullary alignment system was used to make cut the tibial articular surface perpendicular to the anamotic axis of the tibia and in 3 degrees of posterior slope.  The cut surface and alignment jig was removed.  I then used the intramedullary alignment guide to make a 6 valgus cut on the distal femur.  I then marked out the epicondylar axis on the distal femur.  The posterior condylar axis measured 3 degrees.  I then used the anterior referencing sizer and measured the femur to be a size 8.  The 4-In-1 cutting block was screwed into place in external rotation matching the posterior condylar angle, making our cuts perpendicular to the epicondylar axis.  Anterior, posterior and chamfer cuts were made with the sagittal saw.  The cutting block and cut  pieces were removed.  A lamina spreader was placed in 90 degrees of flexion.  The ACL, PCL, menisci, and posterior condylar osteophytes were removed.  A 10 mm spacer blocked was found to offer good flexion and extension gap balance after moderate in degree releasing.   The scoop retractor was then placed and the femoral finishing block was pinned in place.  The small sagittal saw was used as well as the lug drill  to finish the femur.  The block and cut surfaces were removed and the medullary canal hole filled with autograft bone from the cut pieces.  The tibia was delivered forward in deep flexion and external rotation.  A size E tray was selected and pinned into place centered on the medial 1/3 of the tibial tubercle.  The reamer and keel was used to prepare the tibia through the tray.    I then trialed with the size 8 femur, size E tibia, a 10 mm insert and the 35 patella.  I had excellent flexion/extension gap balance, excellent patella tracking.  Flexion was full and beyond 120 degrees; extension was zero.  These components were chosen and the staff opened them to me on the back table while the knee was lavaged copiously and the cement mixed.  The soft tissue was infiltrated with 60cc of exparel 1.3% through a 21 gauge needle.  I cemented in the components and removed all excess cement.  The polyethylene tibial component was snapped into place and the knee placed in extension while cement was hardening.  The capsule was infilltrated with 30cc of .25% Marcaine with epinephrine.  A hemovac was place in the joint exiting superolaterally.  A pain pump was place superomedially superficial to the arthrotomy.  Once the cement was hard, the tourniquet was let down.  Hemostasis was obtained.  The arthrotomy was closed with figure-8 #1 vicryl sutures.  The deep soft tissues were closed with #0 vicryls and the subcuticular layer closed with a running #2-0 vicryl.  The skin was reapproximated and closed with skin staples.  The wound was dressed with xeroform, 4 x4's, 2 ABD sponges, a single layer of webril and a TED stocking.   The patient was then awakened, extubated, and taken to the recovery room in stable condition.  BLOOD LOSS:  300cc DRAINS: 1 hemovac, 1 pain catheter COMPLICATIONS:  None.  PLAN OF CARE: Admit for overnight observation  PATIENT DISPOSITION:  PACU - hemodynamically stable.   Delay start of  Pharmacological VTE agent (>24hrs) due to surgical blood loss or risk of bleeding:  not applicable  Please fax a copy of this op note to my office at [phone] (please only include page 1 and 2 of the Case Information op note)

## 2017-12-10 NOTE — Progress Notes (Signed)
Physical Therapy Treatment Patient Details Name: Brad Fischer MRN: [id number] DOB: Mar 11, 1951 Today's Date: 12/10/2017    History of Present Illness 67 y.o. male s/p L TKA. PMH includes: Seizures, PONV, GERD, Anxiety, Lumbar Spinal Fusion, cardiac catherization, bladder surgery, ankle surgery, Cervical fusion.     PT Comments    Patient is making good progress with PT.  From a mobility standpoint anticipate patient will be ready for DC home when medically ready.    Follow Up Recommendations  Follow surgeon's recommendation for DC plan and follow-up therapies;Supervision for mobility/OOB     Equipment Recommendations  None recommended by PT    Recommendations for Other Services       Precautions / Restrictions Precautions Precautions: Fall;Knee Precaution Comments: knee precautions reviewed with pt Restrictions Weight Bearing Restrictions: Yes LLE Weight Bearing: Weight bearing as tolerated    Mobility  Bed Mobility Overal bed mobility: Modified Independent             General bed mobility comments: increased time and effort  Transfers Overall transfer level: Needs assistance Equipment used: Rolling walker (2 wheeled) Transfers: Sit to/from Stand Sit to Stand: Supervision         General transfer comment: for safety  Ambulation/Gait Ambulation/Gait assistance: Supervision Ambulation Distance (Feet): 150 Feet Assistive device: Rolling walker (2 wheeled) Gait Pattern/deviations: Step-through pattern;Decreased stride length;Decreased weight shift to left;Antalgic Gait velocity: decreased   General Gait Details: cues for posture and sequencing   Stairs            Wheelchair Mobility    Modified Rankin (Stroke Patients Only)       Balance Overall balance assessment: Needs assistance   Sitting balance-Leahy Scale: Normal       Standing balance-Leahy Scale: Fair                              Cognition Arousal/Alertness:  Awake/alert Behavior During Therapy: WFL for tasks assessed/performed Overall Cognitive Status: Within Functional Limits for tasks assessed                                        Exercises Total Joint Exercises Quad Sets: AROM;Left;10 reps Short Arc Quad: AROM;Left;10 reps Heel Slides: AROM;Left;10 reps Hip ABduction/ADduction: AROM;Left;10 reps Straight Leg Raises: AROM;Left;10 reps Long Arc Quad: AROM;Left;10 reps Knee Flexion: AROM;Left;5 reps;Seated;Other (comment)(10 sec holds)    General Comments        Pertinent Vitals/Pain Pain Assessment: Faces Faces Pain Scale: Hurts little more Pain Location: L knee Pain Descriptors / Indicators: Guarding;Sore Pain Intervention(s): Limited activity within patient's tolerance;Monitored during session;Premedicated before session;Repositioned    Home Living                      Prior Function            PT Goals (current goals can now be found in the care plan section) Acute Rehab PT Goals PT Goal Formulation: With patient Time For Goal Achievement: 12/16/17 Potential to Achieve Goals: Good Progress towards PT goals: Progressing toward goals    Frequency    7X/week      PT Plan Current plan remains appropriate    Co-evaluation              AM-PAC PT "6 Clicks" Daily Activity  Outcome Measure  Difficulty turning over in bed (  including adjusting bedclothes, sheets and blankets)?: A Little Difficulty moving from lying on back to sitting on the side of the bed? : A Little Difficulty sitting down on and standing up from a chair with arms (e.g., wheelchair, bedside commode, etc,.)?: A Little Help needed moving to and from a bed to chair (including a wheelchair)?: A Little Help needed walking in hospital room?: A Little Help needed climbing 3-5 steps with a railing? : A Little 6 Click Score: 18    End of Session Equipment Utilized During Treatment: Gait belt Activity Tolerance: Patient  tolerated treatment well Patient left: with call bell/phone within reach;with family/visitor present;in bed Nurse Communication: Mobility status PT Visit Diagnosis: Unsteadiness on feet (R26.81);Other abnormalities of gait and mobility (R26.89);Pain;Difficulty in walking, not elsewhere classified (R26.2) Pain - Right/Left: Left Pain - part of body: Knee     Time: 1345-1417 PT Time Calculation (min) (ACUTE ONLY): 32 min  Charges:  $Gait Training: 8-22 mins $Therapeutic Exercise: 8-22 mins                    G Codes:       Erline LevineKellyn Dajanay Northrup, PTA Pager: [phone]     Carolynne EdouardKellyn R Kamy Poinsett 12/10/2017, 2:43 PM

## 2017-12-10 NOTE — Discharge Summary (Signed)
SPORTS MEDICINE & JOINT REPLACEMENT   Georgena SpurlingStephen Lucey, MD   Laurier Nancyolby Robbins, PA-C [address], [place], [place]  [id number]                             [phone]  PATIENT ID: Oneita HurtWilliam H Hutto        MRN:  [id number]          DOB/AGE: [birth year] / 67 y.o.    DISCHARGE SUMMARY  ADMISSION DATE:    12/09/2017 DISCHARGE DATE:   12/10/2017   ADMISSION DIAGNOSIS: primary osteoarthritis left knee    DISCHARGE DIAGNOSIS:  primary osteoarthritis left knee    ADDITIONAL DIAGNOSIS: Active Problems:   S/P total knee replacement  Past Medical History:  Diagnosis Date  . Allergy    egg whites  . Anemia   . Anxiety   . Arthritis   . Barrett's esophagus   . Cataract   . Chronic headaches   . Colon polyps   . Complication of anesthesia    pt sts he woke up in the middle of the procedure in 1987  . Depression   . Diverticulosis   . Elevated cholesterol   . GERD (gastroesophageal reflux disease)   . Glaucoma   . History of intestinal parasite   . Hypertension   . Interstitial cystitis   . Osteoarthritis of right knee    severe  . PONV (postoperative nausea and vomiting)   . Pre-diabetes    Takes Metformin daily  . Seizures (HCC) 10/09/2011   In ER from severe headache  . Sleep apnea    Does not use CPAP  . UTI (lower urinary tract infection)     PROCEDURE: Procedure(s): LEFT TOTAL KNEE ARTHROPLASTY on 12/09/2017  CONSULTS:    HISTORY:  See H&P in chart  HOSPITAL COURSE:  Oneita HurtWilliam H Vanalstyne is a 67 y.o. admitted on 12/09/2017 and found to have a diagnosis of primary osteoarthritis left knee.  After appropriate laboratory studies were obtained  they were taken to the operating room on 12/09/2017 and underwent Procedure(s): LEFT TOTAL KNEE ARTHROPLASTY.   They were given perioperative antibiotics:  Anti-infectives (From admission, onward)   Start     Dose/Rate Route Frequency Ordered Stop   12/09/17 1700  ceFAZolin (ANCEF) IVPB 2g/100 mL premix     2 g 200 mL/hr over 30  Minutes Intravenous Every 6 hours 12/09/17 1450 12/10/17 0118   12/09/17 0756  ceFAZolin (ANCEF) IVPB 2g/100 mL premix     2 g 200 mL/hr over 30 Minutes Intravenous On call to O.R. 12/09/17 0756 12/09/17 1101    .  Patient given tranexamic acid IV or topical and exparel intra-operatively.  Tolerated the procedure well.    POD# 1: Vital signs were stable.  Patient denied Chest pain, shortness of breath, or calf pain.  Patient was started on Lovenox 30 mg subcutaneously twice daily at 8am.  Consults to PT, OT, and care management were made.  The patient was weight bearing as tolerated.  CPM was placed on the operative leg 0-90 degrees for 6-8 hours a day. When out of the CPM, patient was placed in the foam block to achieve full extension. Incentive spirometry was taught.  Dressing was changed.       POD #2, Continued  PT for ambulation and exercise program.  IV saline locked.  O2 discontinued.    The remainder of the hospital course was dedicated to ambulation and  strengthening.   The patient was discharged on 1 Day Post-Op in  Good condition.  Blood products given:none  DIAGNOSTIC STUDIES: Recent vital signs:  Patient Vitals for the past 24 hrs:  BP Temp Temp src Pulse Resp SpO2  12/10/17 1122 126/83 99.1 F (37.3 C) Oral 83 18 97 %  12/10/17 0441 130/75 98 F (36.7 C) Oral 79 17 99 %  12/10/17 0032 137/78 97.9 F (36.6 C) Oral 72 19 100 %  12/09/17 2000 (!) 145/80 98.4 F (36.9 C) Oral 63 20 100 %  12/09/17 1700 138/69 97.9 F (36.6 C) Oral 68 19 100 %  12/09/17 1451 134/80 98.6 F (37 C) Oral 60 20 100 %  12/09/17 1437 139/79 (!) 97.5 F (36.4 C) - (!) 58 (!) 21 100 %  12/09/17 1422 136/76 - - (!) 57 15 100 %  12/09/17 1407 132/77 - - (!) 52 17 100 %  12/09/17 1352 132/81 - - (!) 54 15 100 %  12/09/17 1337 124/76 - - (!) 51 17 100 %  12/09/17 1322 127/74 - - (!) 53 (!) 21 100 %  12/09/17 1307 122/71 - - (!) 52 12 100 %  12/09/17 1253 - (!) 97.4 F (36.3 C) - - - -   12/09/17 1252 109/71 - - (!) 59 (!) 22 99 %       Recent laboratory studies: Recent Labs    12/10/17 0705  WBC 6.9  HGB 13.2  HCT 40.6  PLT 144*   Recent Labs    12/10/17 0705  NA 132*  K 2.9*  CL 96*  CO2 26  BUN 9  CREATININE 0.83  GLUCOSE 153*  CALCIUM 8.3*   No results found for: INR, PROTIME   Recent Radiographic Studies :  No results found.  DISCHARGE INSTRUCTIONS: Discharge Instructions    CPM   Complete by:  As directed    Continuous passive motion machine (CPM):      Use the CPM from 0 to 90 for 4-6 hours per day.      You may increase by 10 per day.  You may break it up into 2 or 3 sessions per day.      Use CPM for 2 weeks or until you are told to stop.   Call MD / Call 911   Complete by:  As directed    If you experience chest pain or shortness of breath, CALL 911 and be transported to the hospital emergency room.  If you develope a fever above 101 F, pus (white drainage) or increased drainage or redness at the wound, or calf pain, call your surgeon's office.   Constipation Prevention   Complete by:  As directed    Drink plenty of fluids.  Prune juice may be helpful.  You may use a stool softener, such as Colace (over the counter) 100 mg twice a day.  Use MiraLax (over the counter) for constipation as needed.   Diet - low sodium heart healthy   Complete by:  As directed    Discharge instructions   Complete by:  As directed    INSTRUCTIONS AFTER JOINT REPLACEMENT   Remove items at home which could result in a fall. This includes throw rugs or furniture in walking pathways ICE to the affected joint every three hours while awake for 30 minutes at a time, for at least the first 3-5 days, and then as needed for pain and swelling.  Continue to use ice for pain and  swelling. You may notice swelling that will progress down to the foot and ankle.  This is normal after surgery.  Elevate your leg when you are not up walking on it.   Continue to use the breathing  machine you got in the hospital (incentive spirometer) which will help keep your temperature down.  It is common for your temperature to cycle up and down following surgery, especially at night when you are not up moving around and exerting yourself.  The breathing machine keeps your lungs expanded and your temperature down.   DIET:  As you were doing prior to hospitalization, we recommend a well-balanced diet.  DRESSING / WOUND CARE / SHOWERING  Keep the surgical dressing until follow up.  The dressing is water proof, so you can shower without any extra covering.  IF THE DRESSING FALLS OFF or the wound gets wet inside, change the dressing with sterile gauze.  Please use good hand washing techniques before changing the dressing.  Do not use any lotions or creams on the incision until instructed by your surgeon.    ACTIVITY  Increase activity slowly as tolerated, but follow the weight bearing instructions below.   No driving for 6 weeks or until further direction given by your physician.  You cannot drive while taking narcotics.  No lifting or carrying greater than 10 lbs. until further directed by your surgeon. Avoid periods of inactivity such as sitting longer than an hour when not asleep. This helps prevent blood clots.  You may return to work once you are authorized by your doctor.     WEIGHT BEARING   Weight bearing as tolerated with assist device (walker, cane, etc) as directed, use it as long as suggested by your surgeon or therapist, typically at least 4-6 weeks.   EXERCISES  Results after joint replacement surgery are often greatly improved when you follow the exercise, range of motion and muscle strengthening exercises prescribed by your doctor. Safety measures are also important to protect the joint from further injury. Any time any of these exercises cause you to have increased pain or swelling, decrease what you are doing until you are comfortable again and then slowly increase  them. If you have problems or questions, call your caregiver or physical therapist for advice.   Rehabilitation is important following a joint replacement. After just a few days of immobilization, the muscles of the leg can become weakened and shrink (atrophy).  These exercises are designed to build up the tone and strength of the thigh and leg muscles and to improve motion. Often times heat used for twenty to thirty minutes before working out will loosen up your tissues and help with improving the range of motion but do not use heat for the first two weeks following surgery (sometimes heat can increase post-operative swelling).   These exercises can be done on a training (exercise) mat, on the floor, on a table or on a bed. Use whatever works the best and is most comfortable for you.    Use music or television while you are exercising so that the exercises are a pleasant break in your day. This will make your life better with the exercises acting as a break in your routine that you can look forward to.   Perform all exercises about fifteen times, three times per day or as directed.  You should exercise both the operative leg and the other leg as well.   Exercises include:   Quad Sets - Tighten up  the muscle on the front of the thigh (Quad) and hold for 5-10 seconds.   Straight Leg Raises - With your knee straight (if you were given a brace, keep it on), lift the leg to 60 degrees, hold for 3 seconds, and slowly lower the leg.  Perform this exercise against resistance later as your leg gets stronger.  Leg Slides: Lying on your back, slowly slide your foot toward your buttocks, bending your knee up off the floor (only go as far as is comfortable). Then slowly slide your foot back down until your leg is flat on the floor again.  Angel Wings: Lying on your back spread your legs to the side as far apart as you can without causing discomfort.  Hamstring Strength:  Lying on your back, push your heel against  the floor with your leg straight by tightening up the muscles of your buttocks.  Repeat, but this time bend your knee to a comfortable angle, and push your heel against the floor.  You may put a pillow under the heel to make it more comfortable if necessary.   A rehabilitation program following joint replacement surgery can speed recovery and prevent re-injury in the future due to weakened muscles. Contact your doctor or a physical therapist for more information on knee rehabilitation.    CONSTIPATION  Constipation is defined medically as fewer than three stools per week and severe constipation as less than one stool per week.  Even if you have a regular bowel pattern at home, your normal regimen is likely to be disrupted due to multiple reasons following surgery.  Combination of anesthesia, postoperative narcotics, change in appetite and fluid intake all can affect your bowels.   YOU MUST use at least one of the following options; they are listed in order of increasing strength to get the job done.  They are all available over the counter, and you may need to use some, POSSIBLY even all of these options:    Drink plenty of fluids (prune juice may be helpful) and high fiber foods Colace 100 mg by mouth twice a day  Senokot for constipation as directed and as needed Dulcolax (bisacodyl), take with full glass of water  Miralax (polyethylene glycol) once or twice a day as needed.  If you have tried all these things and are unable to have a bowel movement in the first 3-4 days after surgery call either your surgeon or your primary doctor.    If you experience loose stools or diarrhea, hold the medications until you stool forms back up.  If your symptoms do not get better within 1 week or if they get worse, check with your doctor.  If you experience "the worst abdominal pain ever" or develop nausea or vomiting, please contact the office immediately for further recommendations for  treatment.   ITCHING:  If you experience itching with your medications, try taking only a single pain pill, or even half a pain pill at a time.  You can also use Benadryl over the counter for itching or also to help with sleep.   TED HOSE STOCKINGS:  Use stockings on both legs until for at least 2 weeks or as directed by physician office. They may be removed at night for sleeping.  MEDICATIONS:  See your medication summary on the "After Visit Summary" that nursing will review with you.  You may have some home medications which will be placed on hold until you complete the course of blood thinner medication.  It is important for you to complete the blood thinner medication as prescribed.  PRECAUTIONS:  If you experience chest pain or shortness of breath - call 911 immediately for transfer to the hospital emergency department.   If you develop a fever greater that 101 F, purulent drainage from wound, increased redness or drainage from wound, foul odor from the wound/dressing, or calf pain - CONTACT YOUR SURGEON.                                                   FOLLOW-UP APPOINTMENTS:  If you do not already have a post-op appointment, please call the office for an appointment to be seen by your surgeon.  Guidelines for how soon to be seen are listed in your "After Visit Summary", but are typically between 1-4 weeks after surgery.  OTHER INSTRUCTIONS:   Knee Replacement:  Do not place pillow under knee, focus on keeping the knee straight while resting. CPM instructions: 0-90 degrees, 2 hours in the morning, 2 hours in the afternoon, and 2 hours in the evening. Place foam block, curve side up under heel at all times except when in CPM or when walking.  DO NOT modify, tear, cut, or change the foam block in any way.  MAKE SURE YOU:  Understand these instructions.  Get help right away if you are not doing well or get worse.    Thank you for letting us be a part of your medical care team.  It is a  privilege we respect greatly.  We hope these instructions will help you stay on track for a fast and full recovery!   Increase activity slowly as tolerated   Complete by:  As directed       DISCHARGE MEDICATIONS:   Allergies as of 12/10/2017      Reactions   Dutasteride Anaphylaxis   *Avodart*    Sulfamethoxazole Itching   Codeine Itching   Sulfa Antibiotics Itching, Other (See Comments)   headache   Sulfamethoxazole-trimethoprim Other (See Comments)   Severe headaches      Medication List    TAKE these medications   ALPRAZolam 0.5 MG tablet Commonly known as:  XANAX Take 0.5 mg by mouth at bedtime as needed.   aspirin 325 MG EC tablet Take 1 tablet (325 mg total) by mouth 2 (two) times daily.   atenolol 25 MG tablet Commonly known as:  TENORMIN Take 12.5 mg by mouth daily.   calcium citrate-vitamin D 315-200 MG-UNIT tablet Commonly known as:  CITRACAL+D Take 1 tablet by mouth daily.   chlorthalidone 25 MG tablet Commonly known as:  HYGROTON Take 12.5 mg by mouth daily.   cyclobenzaprine 10 MG tablet Commonly known as:  FLEXERIL Take 10 mg by mouth 3 (three) times daily as needed for muscle spasms.   eletriptan 40 MG tablet Commonly known as:  RELPAX Take 40 mg by mouth as needed for migraine. may repeat in 2 hours if necessary   EPINEPHrine 0.3 mg/0.3 mL Soaj injection Commonly known as:  EPI-PEN See admin instructions.   HYDROcodone-acetaminophen 5-325 MG tablet Commonly known as:  NORCO/VICODIN Take 1 tablet by mouth every 6 (six) hours as needed for moderate pain (Pt takes 0.5 tablet twice daily).   lisinopril 10 MG tablet Commonly known as:  PRINIVIL,ZESTRIL Take 5 mg by mouth daily.   metFORMIN  500 MG 24 hr tablet Commonly known as:  GLUCOPHAGE-XR Take 500 mg by mouth daily with breakfast.   omeprazole 20 MG capsule Commonly known as:  PRILOSEC Take 20 mg by mouth every morning.   ondansetron 4 MG tablet Commonly known as:  ZOFRAN Take 1  tablet (4 mg total) by mouth every 6 (six) hours as needed for nausea.   Oxycodone HCl 10 MG Tabs Take 1 tablet (10 mg total) by mouth every 4 (four) hours as needed for severe pain (pain score 7-10).   psyllium 58.6 % powder Commonly known as:  METAMUCIL Take 1 packet by mouth daily as needed (once or twice weekly as needed).       FOLLOW UP VISIT:    DISPOSITION: HOME VS. SNF  CONDITION:  Good   Guy Sandifer 12/10/2017, 12:46 PM

## 2017-12-12 ENCOUNTER — Encounter (HOSPITAL_COMMUNITY): Payer: Self-pay | Admitting: Orthopedic Surgery

## 2018-04-29 ENCOUNTER — Other Ambulatory Visit: Payer: Self-pay | Admitting: Orthopaedic Surgery

## 2018-04-29 ENCOUNTER — Telehealth: Payer: Self-pay | Admitting: Nurse Practitioner

## 2018-04-29 DIAGNOSIS — M542 Cervicalgia: Secondary | ICD-10-CM

## 2018-04-29 NOTE — Telephone Encounter (Signed)
Phone call to patient (spoke with pt's wife) to verify medication list and allergies for myelogram procedure. Pt instructed to stop Relpax 48hrs prior to myelogram appointment time. Pt's wife verbalized understanding.

## 2018-04-30 ENCOUNTER — Other Ambulatory Visit: Payer: Self-pay | Admitting: Orthopaedic Surgery

## 2018-04-30 DIAGNOSIS — M542 Cervicalgia: Secondary | ICD-10-CM

## 2018-05-12 ENCOUNTER — Ambulatory Visit
Admission: RE | Admit: 2018-05-12 | Discharge: 2018-05-12 | Disposition: A | Discharge status: Home / Self Care | Payer: Medicare Other | Source: Ambulatory Visit | Attending: Orthopaedic Surgery | Admitting: Orthopaedic Surgery

## 2018-05-12 DIAGNOSIS — M542 Cervicalgia: Secondary | ICD-10-CM

## 2018-05-12 MED ORDER — DIAZEPAM 5 MG PO TABS
5.0000 mg | ORAL_TABLET | Freq: Once | ORAL | Status: AC
  Administered 2018-05-12: 5 mg via ORAL

## 2018-05-12 MED ORDER — IOPAMIDOL (ISOVUE-M 300) INJECTION 61%
10.0000 mL | Freq: Once | INTRAMUSCULAR | Status: AC | PRN
  Administered 2018-05-12: 10 mL via INTRATHECAL

## 2018-05-12 MED ORDER — MEPERIDINE HCL 50 MG/ML IJ SOLN
50.0000 mg | Freq: Once | INTRAMUSCULAR | Status: AC
  Administered 2018-05-12: 50 mg via INTRAMUSCULAR

## 2018-05-12 MED ORDER — ONDANSETRON HCL 4 MG/2ML IJ SOLN
4.0000 mg | Freq: Once | INTRAMUSCULAR | Status: AC
  Administered 2018-05-12: 4 mg via INTRAMUSCULAR

## 2018-05-12 NOTE — Progress Notes (Signed)
Patient states he has been off Relpax for at least the past two days.

## 2018-05-12 NOTE — Discharge Instructions (Signed)
Myelogram Discharge Instructions  1. Go home and rest quietly for the next 24 hours.  It is important to lie flat for the next 24 hours.  Get up only to go to the restroom.  You may lie in the bed or on a couch on your back, your stomach, your left side or your right side.  You may have one pillow under your head.  You may have pillows between your knees while you are on your side or under your knees while you are on your back.  2. DO NOT drive today.  Recline the seat as far back as it will go, while still wearing your seat belt, on the way home.  3. You may get up to go to the bathroom as needed.  You may sit up for 10 minutes to eat.  You may resume your normal diet and medications unless otherwise indicated.  Drink lots of extra fluids today and tomorrow.  4. The incidence of headache, nausea, or vomiting is about 5% (one in 20 patients).  If you develop a headache, lie flat and drink plenty of fluids until the headache goes away.  Caffeinated beverages may be helpful.  If you develop severe nausea and vomiting or a headache that does not go away with flat bed rest, call [phone].  5. You may resume normal activities after your 24 hours of bed rest is over; however, do not exert yourself strongly or do any heavy lifting tomorrow. If when you get up you have a headache when standing, go back to bed and force fluids for another 24 hours.  6. Call your physician for a follow-up appointment.  The results of your myelogram will be sent directly to your physician by the following day.  7. If you have any questions or if complications develop after you arrive home, please call [phone].  Discharge instructions have been explained to the patient.  The patient, or the person responsible for the patient, fully understands these instructions.  YOU MAY RESTART YOUR RELPAX TOMORROW 05/13/2018 AT 09:30AM.

## 2018-05-14 ENCOUNTER — Ambulatory Visit
Admission: RE | Admit: 2018-05-14 | Discharge: 2018-05-14 | Disposition: A | Discharge status: Home / Self Care | Payer: Medicare Other | Source: Ambulatory Visit | Attending: Orthopaedic Surgery | Admitting: Orthopaedic Surgery

## 2018-05-14 DIAGNOSIS — M542 Cervicalgia: Secondary | ICD-10-CM

## 2018-08-19 ENCOUNTER — Telehealth: Payer: Self-pay | Admitting: Gastroenterology

## 2018-08-19 NOTE — Telephone Encounter (Signed)
   I spoke with him during his wife's recent office appointment.  He was wondering when he needs repeat colonoscopy.  I reviewed his records.  He is due for recall colonoscopy February 2022.  If he has any other questions please let me know.

## 2018-08-19 NOTE — Telephone Encounter (Signed)
The pt has been advised of the colon recall.  He was advised to call with any further questions.

## 2018-12-05 ENCOUNTER — Other Ambulatory Visit: Payer: Self-pay

## 2018-12-05 ENCOUNTER — Ambulatory Visit (HOSPITAL_COMMUNITY)
Admission: RE | Admit: 2018-12-05 | Discharge: 2018-12-05 | Disposition: A | Discharge status: Home / Self Care | Payer: Medicare Other | Source: Ambulatory Visit | Attending: Physician Assistant | Admitting: Physician Assistant

## 2018-12-05 ENCOUNTER — Ambulatory Visit: Payer: Medicare Other | Admitting: Physician Assistant

## 2018-12-05 ENCOUNTER — Encounter: Payer: Self-pay | Admitting: Physician Assistant

## 2018-12-05 VITALS — BP 114/82 | HR 53 | Temp 97.8°F | Ht 68.0 in | Wt 173.0 lb

## 2018-12-05 DIAGNOSIS — R197 Diarrhea, unspecified: Secondary | ICD-10-CM | POA: Diagnosis not present

## 2018-12-05 DIAGNOSIS — R140 Abdominal distension (gaseous): Secondary | ICD-10-CM

## 2018-12-05 DIAGNOSIS — R1011 Right upper quadrant pain: Secondary | ICD-10-CM

## 2018-12-05 DIAGNOSIS — R112 Nausea with vomiting, unspecified: Secondary | ICD-10-CM | POA: Diagnosis present

## 2018-12-05 MED ORDER — OMEPRAZOLE 40 MG PO CPDR: 40.0000 mg | DELAYED_RELEASE_CAPSULE | Freq: Every day | ORAL | 3 refills | Status: AC

## 2018-12-05 NOTE — Progress Notes (Addendum)
Chief Complaint: Bloating, right upper quadrant pain, nausea, vomiting and diarrhea  HPI:    [name] is a 68 year old male, known to Dr. Christella Hartigan, who was referred to me by Brad Kingdom, MD for a complaint of right upper quadrant pain, nausea, vomiting and diarrhea.      Today, the patient presents clinic accompanied by his wife and explains that 3 weeks ago he was eating a country fried steak and about halfway through this meal became "so bloated that I could not eat anymore".  Then started with episodes of vomiting and then had diarrhea.  Ever since then when he eats fatty or fried food he will have episodes of vomiting.  Continues with a constant right upper quadrant discomfort which radiates through to his right shoulder blade rated as a 6-7/10, this can get worse for 10 to 15 seconds at a time during which he describes a "stabbing" sensation rated as a 10/10.  Almost constantly nauseous and has a constant "indigestion/belly ache", with increased reflux over the past few weeks regardless of Prilosec 20mg  qd which he has been on for 5 years..  Has been unable to get comfortable at night and due to this has not been sleeping.  Last night he was so nauseous he had to take a Zofran from his PCP which did help.  Also describes some "pale or green" stools over the past few months.    Describes recent CT abdomen pelvis which was normal from his PCP as well as labs.  We do not have these at time of visit today.    Patient is a retired Counsellor.  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    Denies fever, chills, weight loss or blood in his stool.  Past Medical History:  Diagnosis Date  . Allergy    egg whites  . Anemia   . Anxiety   . Arthritis   . Barrett's esophagus   . Cataract   . Chronic headaches   . Colon polyps   . Complication of anesthesia    pt sts he woke up in the  middle of the procedure in 1987  . Depression   . Diverticulosis   . Elevated cholesterol   . GERD (gastroesophageal reflux disease)   . Glaucoma   . History of intestinal parasite   . Hypertension   . Interstitial cystitis   . Osteoarthritis of right knee    severe  . PONV (postoperative nausea and vomiting)   . Pre-diabetes    Takes Metformin daily  . Seizures ([facility]) 10/09/2011   In ER from severe headache  . Sleep apnea    Does not use CPAP  . UTI (lower urinary tract infection)     Past Surgical History:  Procedure Laterality Date  . ANKLE SURGERY     right  . APPENDECTOMY    . BLADDER SURGERY  12/2013   [facility], [place]  . BLADDER SURGERY  05/2014  . CARDIAC CATHETERIZATION    . Cataract removed Right 10/18/13  . CERVICAL FUSION  2008  . CERVICAL FUSION  11/04/2017  . Cyst removed     Right middle finger  . DG KNEE LEFT COMPLETE ([facility] HX)    . eye lid surgery    . EYE SURGERY    . HEMORROIDECTOMY    . JOINT REPLACEMENT    . KNEE SURGERY  left  . NOSE SURGERY  2013  . Prostate removed  12/2013   [facility], [place]  . SINOSCOPY    . spinal fusion  2007   lower  . TONSILLECTOMY    . TOTAL KNEE ARTHROPLASTY Left 12/09/2017   Procedure: LEFT TOTAL KNEE ARTHROPLASTY;  Surgeon: Dannielle Huh, MD;  Location: [facility] OR;  Service: Orthopedics;  Laterality: Left;    Current Outpatient Medications  Medication Sig Dispense Refill  . ALPRAZolam (XANAX) 0.5 MG tablet Take 0.5 mg by mouth at bedtime as needed.    Marland Kitchen atenolol (TENORMIN) 25 MG tablet Take 12.5 mg by mouth daily.  11  . calcium citrate-vitamin D (CITRACAL+D) 315-200 MG-UNIT tablet Take 1 tablet by mouth daily.     . chlorthalidone (HYGROTON) 25 MG tablet Take 12.5 mg by mouth daily.   11  . cyclobenzaprine (FLEXERIL) 10 MG tablet Take 10 mg by mouth 3 (three) times daily as needed for muscle spasms.     Marland Kitchen eletriptan (RELPAX) 40 MG tablet Take 40 mg by mouth as needed for migraine. may repeat in 2  hours if necessary     . EPINEPHrine 0.3 mg/0.3 mL IJ SOAJ injection See admin instructions.  99  . eszopiclone (LUNESTA) 1 MG TABS tablet Take 1 mg by mouth at bedtime as needed for sleep. Take immediately before bedtime    . HYDROcodone-acetaminophen (NORCO/VICODIN) 5-325 MG per tablet Take 1 tablet by mouth every 6 (six) hours as needed for moderate pain (Pt takes 0.5 tablet twice daily).     Marland Kitchen lisinopril (PRINIVIL,ZESTRIL) 10 MG tablet Take 5 mg by mouth daily.     . metFORMIN (GLUCOPHAGE-XR) 500 MG 24 hr tablet Take 500 mg by mouth daily with breakfast.   6  . omeprazole (PRILOSEC) 20 MG capsule Take 20 mg by mouth every morning.    . ondansetron (ZOFRAN) 4 MG tablet Take 1 tablet (4 mg total) by mouth every 6 (six) hours as needed for nausea. 20 tablet 0  . oxyCODONE 10 MG TABS Take 1 tablet (10 mg total) by mouth every 4 (four) hours as needed for severe pain (pain score 7-10). 40 tablet 0  . psyllium (METAMUCIL) 58.6 % powder Take 1 packet by mouth daily as needed (once or twice weekly as needed).    . rosuvastatin (CRESTOR) 5 MG tablet     . sildenafil (REVATIO) 20 MG tablet Take 20 mg by mouth 3 (three) times daily.    Marland Kitchen omeprazole (PRILOSEC) 40 MG capsule Take 1 capsule (40 mg total) by mouth daily. 90 capsule 3   No current facility-administered medications for this visit.     Allergies as of 12/05/2018 - Review Complete 12/05/2018  Allergen Reaction Noted  . Avodart [dutasteride] Anaphylaxis 10/29/2014  . Codeine Itching 10/18/2017  . Sulfa antibiotics Itching and Other (See Comments) 05/22/2014  . Sulfamethoxazole-trimethoprim Other (See Comments)     Family History  Problem Relation Age of Onset  . Colon cancer Paternal Grandfather 55  . Lung cancer Father   . Esophageal cancer Father   . Prostate cancer Father   . Breast cancer Mother   . COPD Mother   . Heart disease Mother   . Heart disease Maternal Grandfather   . Heart disease Maternal Grandmother   . Stomach  cancer Neg Hx   . Pancreatic cancer Neg Hx     Social History   Socioeconomic History  . Marital status: Married    Spouse name: Not on file  .  Number of children: Not on file  . Years of education: Not on file  . Highest education level: Not on file  Occupational History  . Occupation: retired Engineer, site  Social Needs  . Financial resource strain: Not on file  . Food insecurity:    Worry: Not on file    Inability: Not on file  . Transportation needs:    Medical: Not on file    Non-medical: Not on file  Tobacco Use  . Smoking status: Former Smoker    Last attempt to quit: 09/25/2007    Years since quitting: 11.2  . Smokeless tobacco: Never Used  Substance and Sexual Activity  . Alcohol use: No  . Drug use: No  . Sexual activity: Yes    Partners: Female  Lifestyle  . Physical activity:    Days per week: Not on file    Minutes per session: Not on file  . Stress: Not on file  Relationships  . Social connections:    Talks on phone: Not on file    Gets together: Not on file    Attends religious service: Not on file    Active member of club or organization: Not on file    Attends meetings of clubs or organizations: Not on file    Relationship status: Not on file  . Intimate partner violence:    Fear of current or ex partner: Not on file    Emotionally abused: Not on file    Physically abused: Not on file    Forced sexual activity: Not on file  Other Topics Concern  . Not on file  Social History Narrative  . Not on file    Review of Systems:    Constitutional: No weight loss, fever or chills Skin: No rash  Cardiovascular: No chest pain Respiratory: No SOB  Gastrointestinal: See HPI and otherwise negative Genitourinary: No dysuria  Neurological: No headache, dizziness or syncope Musculoskeletal: No new muscle or joint pain Hematologic: No bleeding  Psychiatric: No history of depression or anxiety   Physical Exam:  Vital signs: BP 114/82   Pulse (!) 53    Temp 97.8 F (36.6 C)   Ht  (1.727 m)   Wt 173 lb (78.5 kg)   BMI 26.30 kg/m   Constitutional:   Pleasant Caucasian male appears to be in NAD, Well developed, Well nourished, alert and cooperative Head:  Normocephalic and atraumatic. Eyes:   PEERL, EOMI. No icterus. Conjunctiva pink. Ears:  Normal auditory acuity. Neck:  Supple Throat: Oral cavity and pharynx without inflammation, swelling or lesion.  Respiratory: Respirations even and unlabored. Lungs clear to auscultation bilaterally.   No wheezes, crackles, or rhonchi.  Cardiovascular: Normal S1, S2. No MRG. Regular rate and rhythm. No peripheral edema, cyanosis or pallor.  Gastrointestinal:  Soft, nondistended, moderate RUQ ttp, No rebound or guarding. Normal bowel sounds. No appreciable masses or hepatomegaly. Rectal:  Not performed.  Msk:  Symmetrical without gross deformities.  Neurologic:  Alert and  oriented x4;  grossly normal neurologically.  Skin:   Dry and intact without significant lesions or rashes. Psychiatric: Demonstrates good judgement and reason without abnormal affect or behaviors.  Requesting recent labs and imaging.  Assessment: 1.  Right upper quadrant pain: Over the past 3 weeks, related to fatty foods with nausea, vomiting and some diarrhea; most likely gallbladder related versus gastritis versus other 2.  Nausea and vomiting 3.  Diarrhea 4.  Bloating 5.  GERD: Increased over the past 3 weeks,  consider relation to gallbladder versus gastritis versus other  Plan: 1.  Discussed with patient it sounds as though his symptoms are gallbladder related.  Agree with the work-up being done by his PCP.  We will try to get an ultrasound sooner for him as this is not scheduled until next Tuesday. 2.  Pending results of right upper quadrant ultrasound patient may benefit from a HIDA scan for further eval. 3.  We will request recent CT and labs done by PCP 4.  Encouraged the patient to maintain a low-fat/no fried  diet while we are working this up 5.  Increase Omeprazole to 40 mg daily, provided prescription #30 with 3 refills 6.  Patient to follow in clinic per recommendations after testing above.  Hyacinth Meeker, PA-C [facility] 12/05/2018, 9:13 AM  Cc: Brad Kingdom, MD   Addendum: 12/15/2018 1307  Records received from PCP:  07/17/2018 fecal occult blood negative 07/15/2018 CMP normal 07/15/2018 TSH normal, hemoglobin A1c 5.7, CBC normal 11/20/2018 CT abdomen pelvis with contrast showed bilateral nonobstructive nephrolithiasis, sigmoid diverticulosis without inflammation and no acute abnormality in the abdomen or pelvis as well as aortic atherosclerosis  No change to recommendations.  Hyacinth Meeker, PA-C

## 2018-12-05 NOTE — Patient Instructions (Signed)
You have been scheduled for a RUQ abdominal ultrasound at [facility] (1st floor of hospital) on 12/05/2018 at 3:00pm. Please arrive 15 minutes prior to your appointment for registration. Make certain not to have anything to eat or drink 6 hours prior to your appointment. Should you need to reschedule your appointment, please contact radiology at [phone]. This test typically takes about 30 minutes to perform.  We have sent the following medications to your pharmacy for you to pick up at your convenience:  Omeprazole 40mg   Eat a low fat/no fried food diet

## 2018-12-05 NOTE — Progress Notes (Signed)
I agree with the above note, plan 

## 2018-12-08 ENCOUNTER — Other Ambulatory Visit: Payer: Self-pay

## 2018-12-08 ENCOUNTER — Telehealth: Payer: Self-pay | Admitting: Physician Assistant

## 2018-12-08 DIAGNOSIS — R1011 Right upper quadrant pain: Secondary | ICD-10-CM

## 2018-12-08 NOTE — Telephone Encounter (Signed)
Patient calling back for [place] results from 3.13.20.

## 2018-12-17 ENCOUNTER — Ambulatory Visit (HOSPITAL_COMMUNITY): Payer: Medicare Other

## 2019-01-08 ENCOUNTER — Ambulatory Visit (HOSPITAL_COMMUNITY)
Admission: RE | Admit: 2019-01-08 | Discharge: 2019-01-08 | Disposition: A | Discharge status: Home / Self Care | Payer: Medicare Other | Source: Ambulatory Visit | Attending: Physician Assistant | Admitting: Physician Assistant

## 2019-01-08 ENCOUNTER — Other Ambulatory Visit: Payer: Self-pay

## 2019-01-08 DIAGNOSIS — R1011 Right upper quadrant pain: Secondary | ICD-10-CM | POA: Insufficient documentation

## 2019-01-08 MED ORDER — TECHNETIUM TC 99M MEBROFENIN IV KIT
5.4000 | PACK | Freq: Once | INTRAVENOUS | Status: AC | PRN
  Administered 2019-01-08: 5.4 via INTRAVENOUS

## 2019-02-09 ENCOUNTER — Encounter: Payer: Self-pay | Admitting: Gastroenterology

## 2019-02-09 ENCOUNTER — Ambulatory Visit (INDEPENDENT_AMBULATORY_CARE_PROVIDER_SITE_OTHER): Payer: Medicare Other | Admitting: Gastroenterology

## 2019-02-09 ENCOUNTER — Other Ambulatory Visit: Payer: Self-pay

## 2019-02-09 VITALS — Ht 68.0 in | Wt 167.0 lb

## 2019-02-09 DIAGNOSIS — R1011 Right upper quadrant pain: Secondary | ICD-10-CM

## 2019-02-09 DIAGNOSIS — R112 Nausea with vomiting, unspecified: Secondary | ICD-10-CM | POA: Diagnosis not present

## 2019-02-09 NOTE — Patient Instructions (Signed)
We will arrange EGD for his nasuea, vomiting, RUQ pains and h/o Barrett's change.  Will also arrange [facility] referral for the same (RUQ pains, nausea, vomiting) to consider lap chole if EGD above is unrevealing.

## 2019-02-09 NOTE — Progress Notes (Signed)
Review of gastrointestinal problems:  1. Barrett's esophagus: EGD reports from 2009, 2008, 2007 and several predating that. The 2007 and 2008 EGDs document irregular Z line, pathology proved Barrett's mucosa without dysplasia (Dr. Lyda Jester) The 2009 EGD also documents irregular Z line however biopsies did not show Barrett's change. 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  EGD Ardis Hughs 11/2012 (for dyspepsia, and equivocal sprue markers; biopsies of duodenum normal.  Z line was slightly irregular, non-nodular (not biopsied?).  EGD 12/2014 Dr. Ardis Hughs mild gastritis, irregular, non-nodular Z line, 3-4cm HH; path showed no H .pylori, + IM without dysplasia, recommended recall at 4 years.  2. Colon polyps: He had a colonoscopy in 2007 and this showed diverticulosis without polyps. Pt is very clear that a previous colonoscopy in 1990s showed precancerous polyps. 10/2010 colonoscopy no polyps; recall at 5 years. 3. RUQ pains workup 2019-20 by PCP: 06/2018 labs  fecal occult blood negative, CMP normal, TSH normal, hemoglobin A1c 5.7, CBC normal 10/2018 CT abdomen pelvis with contrast showed bilateral nonobstructive nephrolithiasis, sigmoid diverticulosis without inflammation and no acute abnormality in the abdomen or pelvis as well as aortic atherosclerosis.  11/2018 [place] normal. 12/2018 HIDA normal GB EF, + pain when drinking ensure.  This service was provided via virtual visit.   Only audio was used.  The patient was located at home.  I was located in my office.  The patient did consent to this virtual visit and is aware of possible charges through their insurance for this visit.  The patient is an established patient.  My certified medical assistant, Grace Bushy, contributed to this visit by contacting the patient by phone 1 or 2 business days prior to the appointment and also followed up on the recommendations I made after the visit.  Time spent on virtual visit:  24 minutes   HPI: This is a very pleasant 68 yo man who was last here in our office about 2 months ago.  Had dinner in feb 2020, felt terrible during a dinner. Then terrible vomiting and diarrhea. This recurred every other night for about 2 weeks.  He is constantly having nausea, zofran almost always helps.Has intermittent RUQ pains that radiate to his back Real difficulty especially after fried foods.  He had labs, CT, [place], HIDA scan (see all the results above) without clear etiology.  stll having the myriad UGI symptoms and also occasionally diarrhea.  Stable weight   Chief complaint is nasuea, vomiting, RUQ pains, diarreha  ROS: complete GI ROS as described in HPI, all other review negative.  Constitutional:  No unintentional weight loss   Past Medical History:  Diagnosis Date  . Allergy    egg whites  . Anemia   . Anxiety   . Arthritis   . Barrett's esophagus   . Cataract   . Chronic headaches   . Colon polyps   . Complication of anesthesia    pt sts he woke up in the middle of the procedure in 1987  . Depression   . Diverticulosis   . Elevated cholesterol   . GERD (gastroesophageal reflux disease)   . Glaucoma   . History of intestinal parasite   . Hypertension   . Interstitial cystitis   . Osteoarthritis of right knee    severe  . PONV (postoperative nausea and vomiting)   . Pre-diabetes    Takes Metformin daily  . Seizures ([facility]) 10/09/2011   In ER from severe headache  .  Sleep apnea    Does not use CPAP  . UTI (lower urinary tract infection)     Past Surgical History:  Procedure Laterality Date  . ANKLE SURGERY     right  . APPENDECTOMY    . BLADDER SURGERY  12/2013   [facility], [place]  . BLADDER SURGERY  05/2014  . CARDIAC CATHETERIZATION    . Cataract removed Right 10/18/13  . CERVICAL FUSION  2008  . CERVICAL FUSION  11/04/2017  . Cyst removed     Right middle finger  . DG KNEE LEFT COMPLETE ([place] HX)    . eye lid surgery    . EYE  SURGERY    . HEMORROIDECTOMY    . JOINT REPLACEMENT    . KNEE SURGERY     left  . NOSE SURGERY  2013  . Prostate removed  12/2013   [facility], [place]  . SINOSCOPY    . spinal fusion  2007   lower  . TONSILLECTOMY    . TOTAL KNEE ARTHROPLASTY Left 12/09/2017   Procedure: LEFT TOTAL KNEE ARTHROPLASTY;  Surgeon: Vickey Huger, MD;  Location: [facility];  Service: Orthopedics;  Laterality: Left;    Current Outpatient Medications  Medication Sig Dispense Refill  . ALPRAZolam (XANAX) 0.5 MG tablet Take 0.5 mg by mouth at bedtime as needed.    Marland Kitchen atenolol (TENORMIN) 25 MG tablet Take 12.5 mg by mouth daily.  11  . chlorthalidone (HYGROTON) 25 MG tablet Take 12.5 mg by mouth daily.   11  . cyclobenzaprine (FLEXERIL) 10 MG tablet Take 10 mg by mouth 3 (three) times daily as needed for muscle spasms.     Marland Kitchen docusate sodium (COLACE) 100 MG capsule Take 100 mg by mouth 2 (two) times daily.    Marland Kitchen eletriptan (RELPAX) 40 MG tablet Take 40 mg by mouth as needed for migraine. may repeat in 2 hours if necessary     . EPINEPHrine 0.3 mg/0.3 mL IJ SOAJ injection See admin instructions.  99  . eszopiclone (LUNESTA) 1 MG TABS tablet Take 1 mg by mouth at bedtime as needed for sleep. Take immediately before bedtime    . HYDROcodone-acetaminophen (NORCO/VICODIN) 5-325 MG per tablet Take 10-325 tablets by mouth every 6 (six) hours as needed for moderate pain (Pt takes 0.5 tablet twice daily).     Marland Kitchen lisinopril (PRINIVIL,ZESTRIL) 10 MG tablet Take 5 mg by mouth daily.     Marland Kitchen omeprazole (PRILOSEC) 40 MG capsule Take 1 capsule (40 mg total) by mouth daily. 90 capsule 3  . ondansetron (ZOFRAN) 4 MG tablet Take 1 tablet (4 mg total) by mouth every 6 (six) hours as needed for nausea. 20 tablet 0  . psyllium (METAMUCIL) 58.6 % powder Take 1 packet by mouth daily as needed (once or twice weekly as needed).    . rosuvastatin (CRESTOR) 5 MG tablet Take 5 mg by mouth once a week.     . sildenafil (REVATIO) 20 MG tablet Take  20 mg by mouth 3 (three) times daily.     No current facility-administered medications for this visit.     Allergies as of 02/09/2019 - Review Complete 02/09/2019  Allergen Reaction Noted  . Avodart [dutasteride] Anaphylaxis 10/29/2014  . Codeine Itching 10/18/2017  . Sulfa antibiotics Itching and Other (See Comments) 05/22/2014  . Sulfamethoxazole-trimethoprim Other (See Comments)     Family History  Problem Relation Age of Onset  . Colon cancer Paternal Grandfather 67  . Lung cancer Father   .  Esophageal cancer Father   . Prostate cancer Father   . Breast cancer Mother   . COPD Mother   . Heart disease Mother   . Heart disease Maternal Grandfather   . Heart disease Maternal Grandmother   . Stomach cancer Neg Hx   . Pancreatic cancer Neg Hx     Social History   Socioeconomic History  . Marital status: Married    Spouse name: Not on file  . Number of children: Not on file  . Years of education: Not on file  . Highest education level: Not on file  Occupational History  . Occupation: retired Education officer, museum  Social Needs  . Financial resource strain: Not on file  . Food insecurity:    Worry: Not on file    Inability: Not on file  . Transportation needs:    Medical: Not on file    Non-medical: Not on file  Tobacco Use  . Smoking status: Former Smoker    Last attempt to quit: 09/25/2007    Years since quitting: 11.3  . Smokeless tobacco: Never Used  Substance and Sexual Activity  . Alcohol use: No  . Drug use: No  . Sexual activity: Yes    Partners: Female  Lifestyle  . Physical activity:    Days per week: Not on file    Minutes per session: Not on file  . Stress: Not on file  Relationships  . Social connections:    Talks on phone: Not on file    Gets together: Not on file    Attends religious service: Not on file    Active member of club or organization: Not on file    Attends meetings of clubs or organizations: Not on file    Relationship status: Not on  file  . Intimate partner violence:    Fear of current or ex partner: Not on file    Emotionally abused: Not on file    Physically abused: Not on file    Forced sexual activity: Not on file  Other Topics Concern  . Not on file  Social History Narrative  . Not on file     Physical Exam: Unable to perform because this was a "telemed visit" due to current Covid-19 pandemic  Assessment and plan: 68 y.o. male with nasuea, vomiting, RUQ pains, intermittent diarrhea.  Unclear etiology after CT, [place], HIDA, labs.  I recommended EGD (has h/o barrett's and was 'due' for EGD around now anyway) and also referral to [facility] to consider elective cholecystectomy for what admittedly atypical GB symptoms.  Please see the "Patient Instructions" section for addition details about the plan.  Owens Loffler, MD [facility] 02/09/2019, 1:57 PM

## 2019-02-11 ENCOUNTER — Telehealth: Payer: Self-pay | Admitting: *Deleted

## 2019-02-11 NOTE — Telephone Encounter (Signed)
Covid-19 travel screening questions  Have you traveled in the last 14 days? No If yes where?   Do you now or have you had a fever in the last 14 days? No  Do you have any respiratory symptoms of shortness of breath or cough now or in the last 14 days? No  Do you have any family members or close contacts with diagnosed or suspected Covid-19? No  Patient aware care partner needs to wait in the car in the parking lot and to wear a mask into the building.

## 2019-02-13 ENCOUNTER — Encounter: Payer: Self-pay | Admitting: Gastroenterology

## 2019-02-13 ENCOUNTER — Other Ambulatory Visit: Payer: Self-pay

## 2019-02-13 ENCOUNTER — Ambulatory Visit (AMBULATORY_SURGERY_CENTER): Payer: Medicare Other | Admitting: Gastroenterology

## 2019-02-13 VITALS — BP 125/80 | HR 64 | Temp 98.7°F | Resp 18 | Ht 68.0 in | Wt 167.0 lb

## 2019-02-13 DIAGNOSIS — R1011 Right upper quadrant pain: Secondary | ICD-10-CM

## 2019-02-13 DIAGNOSIS — K3189 Other diseases of stomach and duodenum: Secondary | ICD-10-CM

## 2019-02-13 DIAGNOSIS — K449 Diaphragmatic hernia without obstruction or gangrene: Secondary | ICD-10-CM

## 2019-02-13 DIAGNOSIS — K2970 Gastritis, unspecified, without bleeding: Secondary | ICD-10-CM | POA: Diagnosis not present

## 2019-02-13 DIAGNOSIS — R112 Nausea with vomiting, unspecified: Secondary | ICD-10-CM

## 2019-02-13 MED ORDER — SODIUM CHLORIDE 0.9 % IV SOLN: 500.0000 mL | Freq: Once | INTRAVENOUS | Status: DC

## 2019-02-13 NOTE — Op Note (Signed)
[facility] Patient Name: Brad Fischer Procedure Date: 02/13/2019 10:01 AM MRN: [id number] Endoscopist: Rachael Fee , MD Age: 68 Referring MD:  Date of Birth: 08/16/1951 Gender: Male Account #: [id number] Procedure:                Upper GI endoscopy Indications:              Abdominal pain in the right upper quadrant; CT, [place],                            HIDA in past several months all essentially normal.                            labs 2019-2020 normal. Medicines:                Monitored Anesthesia Care Procedure:                Pre-Anesthesia Assessment:                           - Prior to the procedure, a History and Physical                            was performed, and patient medications and                            allergies were reviewed. The patient's tolerance of                            previous anesthesia was also reviewed. The risks                            and benefits of the procedure and the sedation                            options and risks were discussed with the patient.                            All questions were answered, and informed consent                            was obtained. Prior Anticoagulants: The patient has                            taken no previous anticoagulant or antiplatelet                            agents. ASA Grade Assessment: II - A patient with                            mild systemic disease. After reviewing the risks                            and benefits, the patient was deemed in  satisfactory condition to undergo the procedure.                           After obtaining informed consent, the endoscope was                            passed under direct vision. Throughout the                            procedure, the patient's blood pressure, pulse, and                            oxygen saturations were monitored continuously. The                            Endoscope was introduced  through the mouth, and                            advanced to the second part of duodenum. The upper                            GI endoscopy was accomplished without difficulty.                            The patient tolerated the procedure well. Scope In: Scope Out: Findings:                 Mild inflammation characterized by erythema,                            friability and granularity was found in the gastric                            antrum. Biopsies were taken with a cold forceps for                            histology.                           A medium-sized hiatal hernia was present.                           The exam was otherwise without abnormality. Complications:            No immediate complications. Estimated blood loss:                            None. Estimated Blood Loss:     Estimated blood loss: none. Impression:               - Gastritis. Biopsied to check for H. pylori.                           - Medium-sized hiatal hernia.                           -  The examination was otherwise normal. Recommendation:           - Patient has a contact number available for                            emergencies. The signs and symptoms of potential                            delayed complications were discussed with the                            patient. Return to normal activities tomorrow.                            Written discharge instructions were provided to the                            patient.                           - Resume previous diet.                           - Continue present medications.                           - Await pathology results. Will start appropriate                            antibiotics if H. pylori is noted. If not, will                            continue referral to surgery to consider elective                            lap chole Rachael Feeaniel P Sigurd Pugh, MD 02/13/2019 10:10:31 AM This report has been signed electronically.

## 2019-02-13 NOTE — Progress Notes (Signed)
Pt's states no medical or surgical changes since previsit or office visit. 

## 2019-02-13 NOTE — Patient Instructions (Signed)
Discharge instructions given. Biopsies taken. Handout on Hiatal Hernia. Resume previous medications. YOU HAD AN ENDOSCOPIC PROCEDURE TODAY AT THE [facility]:   Refer to the procedure report that was given to you for any specific questions about what was found during the examination.  If the procedure report does not answer your questions, please call your gastroenterologist to clarify.  If you requested that your care partner not be given the details of your procedure findings, then the procedure report has been included in a sealed envelope for you to review at your convenience later.  YOU SHOULD EXPECT: Some feelings of bloating in the abdomen. Passage of more gas than usual.  Walking can help get rid of the air that was put into your GI tract during the procedure and reduce the bloating. If you had a lower endoscopy (such as a colonoscopy or flexible sigmoidoscopy) you may notice spotting of blood in your stool or on the toilet paper. If you underwent a bowel prep for your procedure, you may not have a normal bowel movement for a few days.  Please Note:  You might notice some irritation and congestion in your nose or some drainage.  This is from the oxygen used during your procedure.  There is no need for concern and it should clear up in a day or so.  SYMPTOMS TO REPORT IMMEDIATELY:    Following upper endoscopy (EGD)  Vomiting of blood or coffee ground material  New chest pain or pain under the shoulder blades  Painful or persistently difficult swallowing  New shortness of breath  Fever of 100F or higher  Black, tarry-looking stools  For urgent or emergent issues, a gastroenterologist can be reached at any hour by calling [phone].   DIET:  We do recommend a small meal at first, but then you may proceed to your regular diet.  Drink plenty of fluids but you should avoid alcoholic beverages for 24 hours.  ACTIVITY:  You should plan to take it easy for the rest of  today and you should NOT DRIVE or use heavy machinery until tomorrow (because of the sedation medicines used during the test).    FOLLOW UP: Our staff will call the number listed on your records 48-72 hours following your procedure to check on you and address any questions or concerns that you may have regarding the information given to you following your procedure. If we do not reach you, we will leave a message.  We will attempt to reach you two times.  During this call, we will ask if you have developed any symptoms of COVID 19. If you develop any symptoms (for example fever, flu-like symptoms, shortness of breath, cough etc.) before then, please call [phone].  If any biopsies were taken you will be contacted by phone or by letter within the next 1-3 weeks.  Please call us at [phone] if you have not heard about the biopsies in 3 weeks.    SIGNATURES/CONFIDENTIALITY: You and/or your care partner have signed paperwork which will be entered into your electronic medical record.  These signatures attest to the fact that that the information above on your After Visit Summary has been reviewed and is understood.  Full responsibility of the confidentiality of this discharge information lies with you and/or your care-partner.

## 2019-02-13 NOTE — Progress Notes (Signed)
PT taken to PACU. Monitors in place. VSS. Report given to RN. 

## 2019-02-13 NOTE — Progress Notes (Signed)
Temperature taken by Courtney Washington, CMA, VS taken by Judy Branson, CMA 

## 2019-02-17 ENCOUNTER — Telehealth: Payer: Self-pay | Admitting: *Deleted

## 2019-02-17 NOTE — Telephone Encounter (Signed)
  Follow up Call-  Call back number 02/13/2019  Post procedure Call Back phone  # [phone]  Permission to leave phone message Yes  Some recent data might be hidden     Patient questions:  Do you have a fever, pain , or abdominal swelling? No. Pain Score  0 *  Have you tolerated food without any problems? Yes.    Have you been able to return to your normal activities? Yes.    Do you have any questions about your discharge instructions: Diet   No. Medications  No. Follow up visit  No.  Do you have questions or concerns about your Care? No.  Actions: * If pain score is 4 or above: 1. No action needed, pain <4.Have you developed a fever since your procedure? no  2.   Have you had an respiratory symptoms (SOB or cough) since your procedure? no  3.   Have you tested positive for COVID 19 since your procedure no  4.   Have you had any family members/close contacts diagnosed with the COVID 19 since your procedure?  no   If any of these questions are a yes, please inquire if patient has been seen by family doctor and route this note to Laverna Peace, Charity fundraiser.

## 2019-02-17 NOTE — Telephone Encounter (Signed)
On f/u call ,pt gave me name and address of a physician that he would like Dr Christella Hartigan to refer him to (for possible gallbladder symptoms) : Dr. Antony Madura MD [address] [facility] [facility] [place] [address] telephone # [phone]. This information was given to Barbaraann Rondo( in Dr Christella Hartigan office)  to make referral .

## 2019-07-18 IMAGING — MR MR CERVICAL SPINE W/O CM
5 series · 29 of 48 positions shown · non-contrast
Comparison: MRI cervical spine 04/04/2012.

CLINICAL DATA: History of cervical fusion in 3884. Chronic neck
pain and stiffness, worse on the left and radiating into the left
elbow.

EXAM:
MRI CERVICAL SPINE WITHOUT CONTRAST
TECHNIQUE: Multiplanar, multisequence MR imaging of the cervical spine was
performed. No intravenous contrast was administered.

[Series 3: T2 · sagittal · 3.0mm · 0.41mm/px · 5 of 15 slices shown (1 of 2)]
[im 1/15]
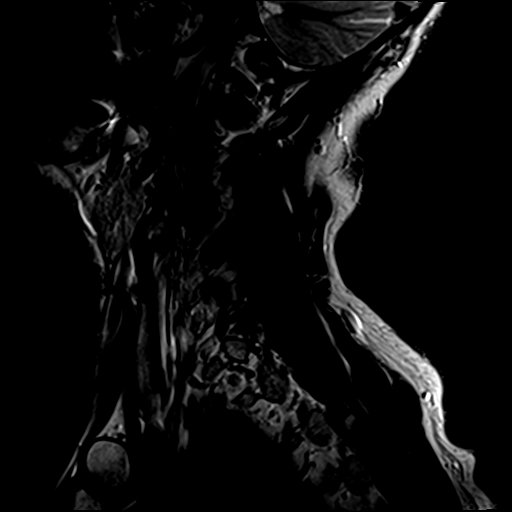
[im 4/15]
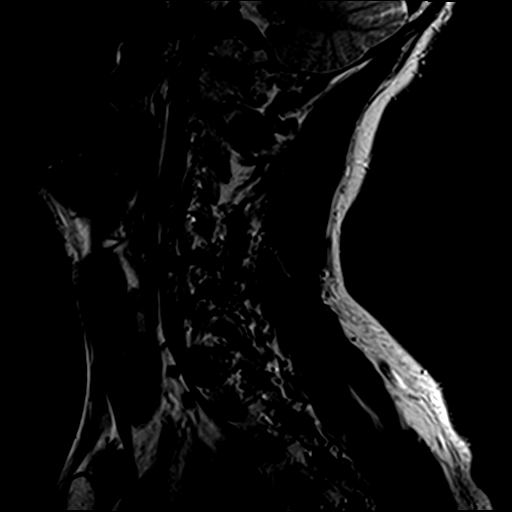
[im 8/15]
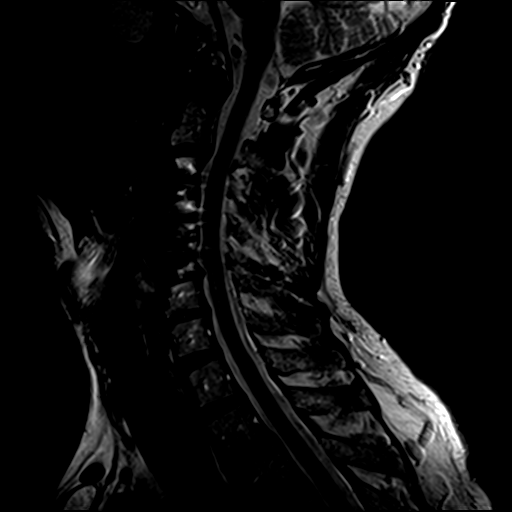
[im 11/15]
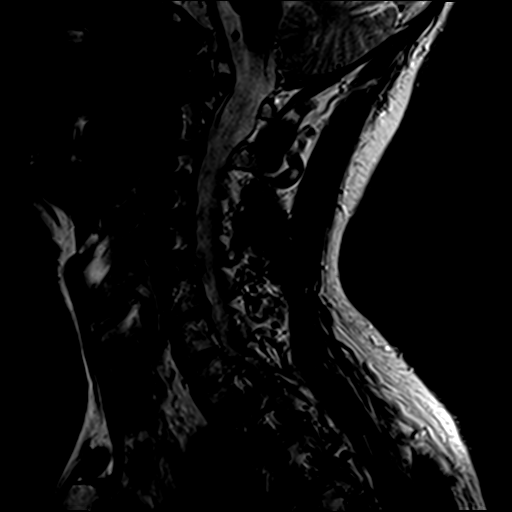
[im 15/15]
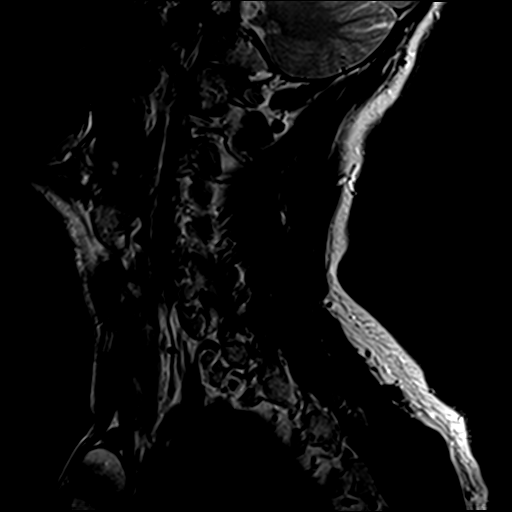

[Series 4: STIR · sagittal · 3.0mm · 0.82mm/px · 5 of 15 slices shown]
[im 1/15]
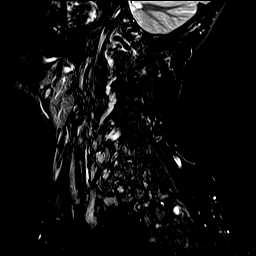
[im 4/15]
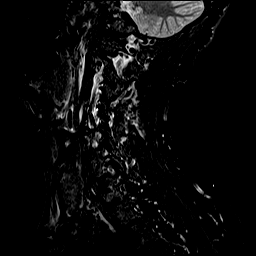
[im 8/15]
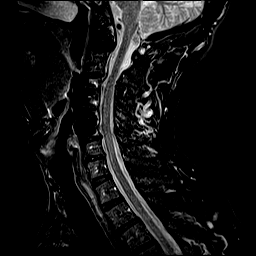
[im 11/15]
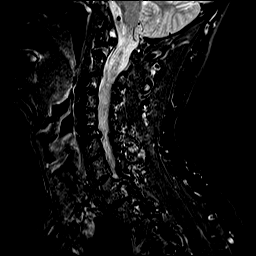
[im 15/15]
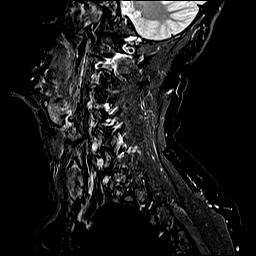

[Series 5: T1 · sagittal · 3.0mm · 0.41mm/px · 6 of 15 slices shown]
[im 1/15]
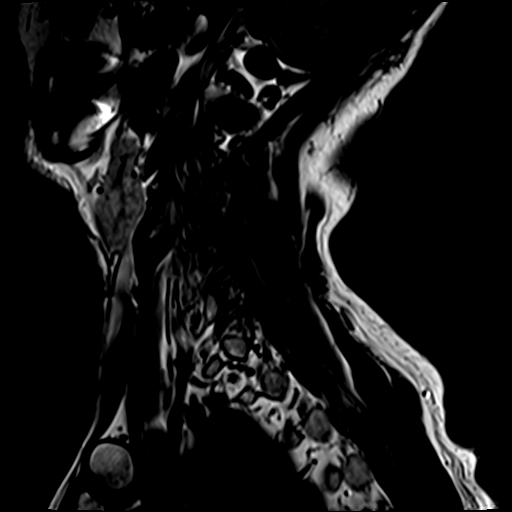
[im 3/15]
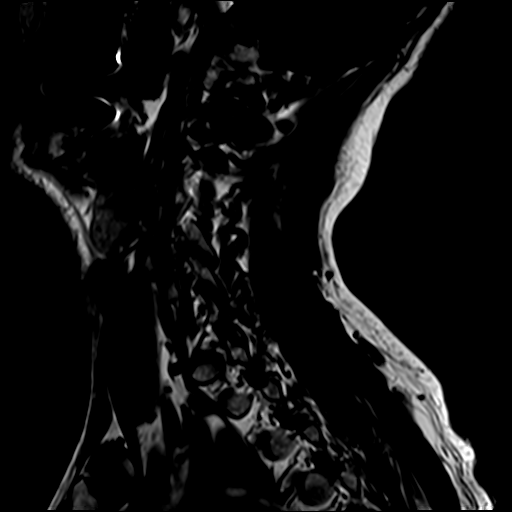
[im 6/15]
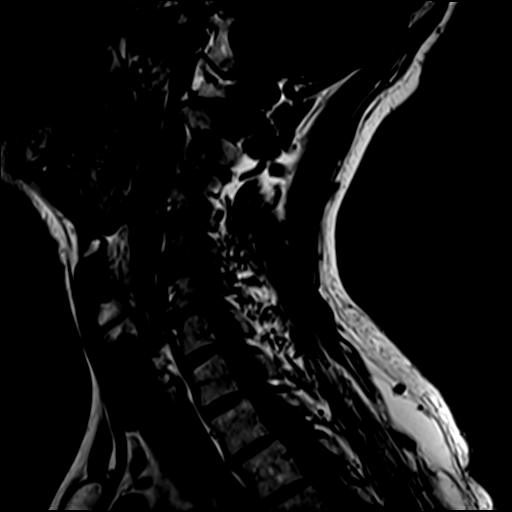
[im 9/15]
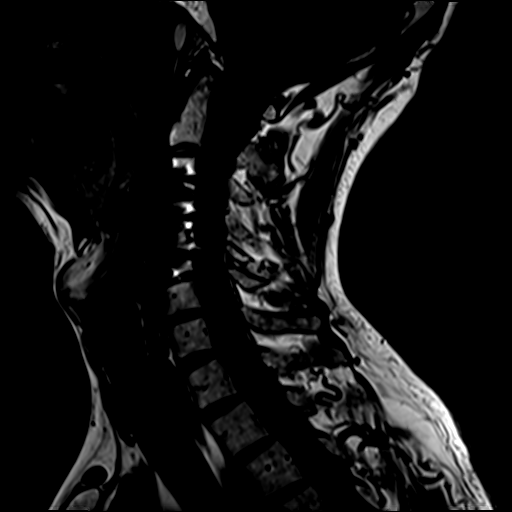
[im 12/15]
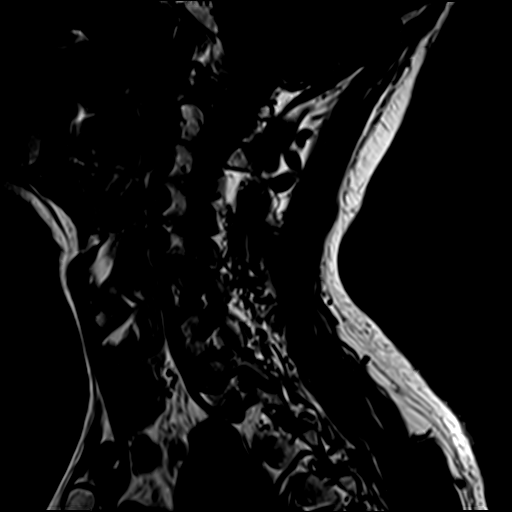
[im 15/15]
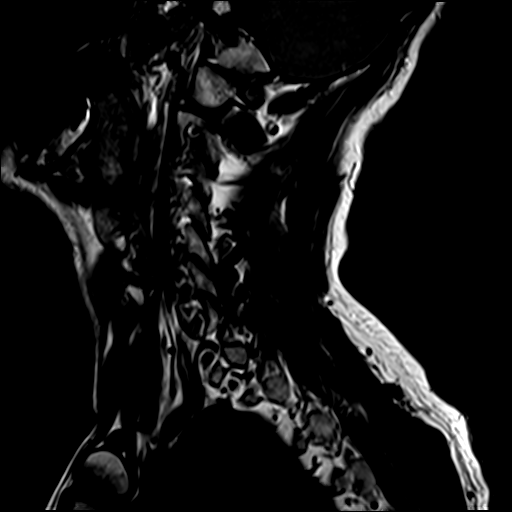

[Series 6: T2 · axial · 3.0mm · 0.70mm/px · z∈[-159,-10]mm · 10 of 42 slices shown (2 of 2)]
[im 3/42]
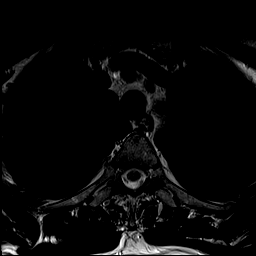
[im 6/42]
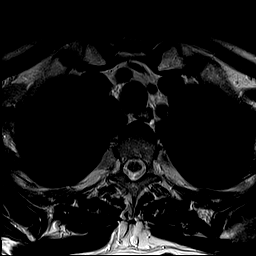
[im 9/42]
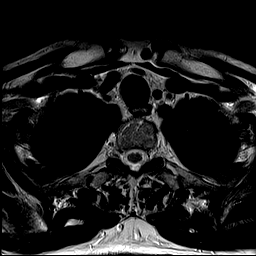
[im 14/42]
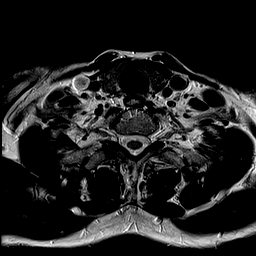
[im 20/42]
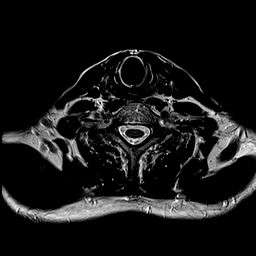
[im 22/42]
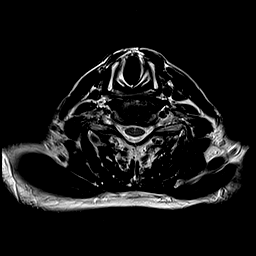
[im 25/42]
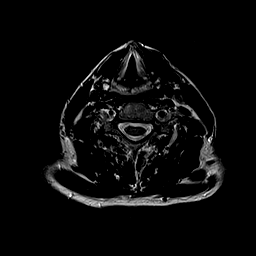
[im 31/42]
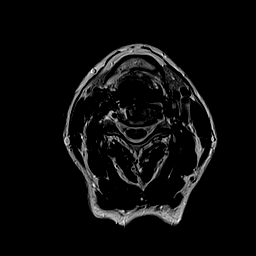
[im 36/42]
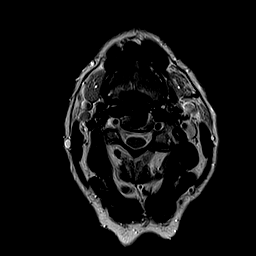
[im 42/42]
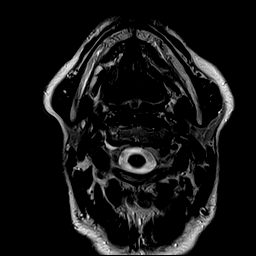

[Series 7: GRE · axial · 3.0mm · 0.35mm/px · z∈[-159,-117]mm · 3 of 42 slices shown]
[im 3/42]
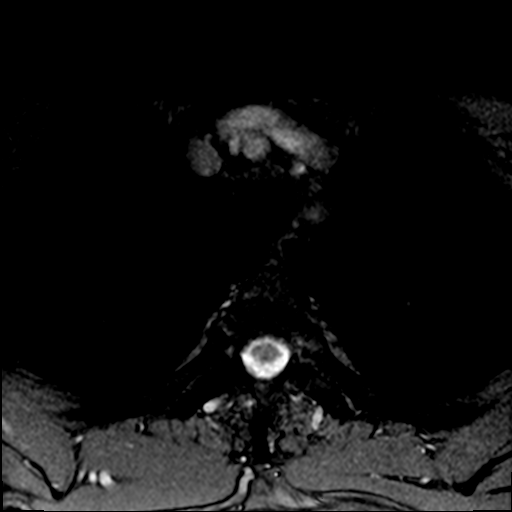
[im 9/42]
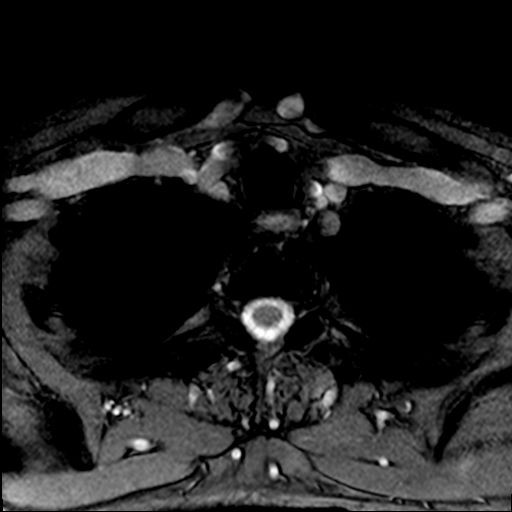
[im 14/42]
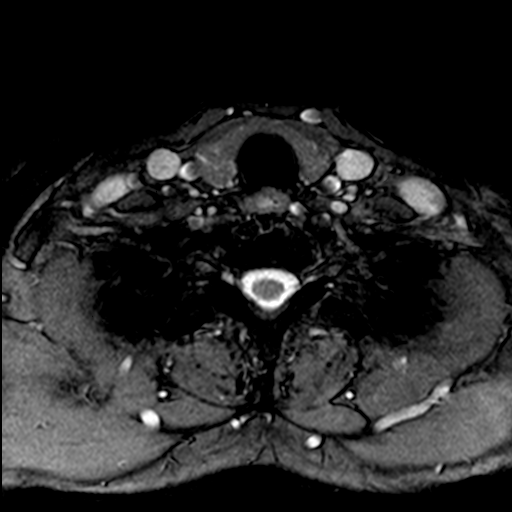

[29 of 48 positions shown; findings below may reference images not displayed]

FINDINGS: Alignment: Maintained.

Vertebrae: No fracture or worrisome lesion. The patient is status
post C3-5 fusion as seen on the prior exam.

Cord: Normal signal throughout.

Posterior Fossa, vertebral arteries, paraspinal tissues: Negative.

Disc levels:

C2-3: The patient has a left paracentral protrusion which is new
since the prior examination. The disc indents the ventral thecal sac
but the central canal and foramina are open. The disc could impinge
on the left C3 root in the foraminal entry zone but no nerve root
compression is present.

C3-4: Status post discectomy and fusion. The central canal and
foramina are widely patent.

C4-5: Status post discectomy and fusion. The central canal and
foramina are widely patent.

C5-6: Disc bulge and uncovertebral spurring appear unchanged. The
ventral thecal sac is effaced but the central canal appears open.
Mild foraminal narrowing is worse on the left.

C6-7: There is some uncovertebral spurring on the left which could
impact the exiting left C7 root. The central canal and right foramen
are open. The appearance is not markedly changed.

C7-T1: Negative.
IMPRESSION: New left paracentral protrusion at C2-3 without central canal or
foraminal stenosis. It is possible the disc could irritate the left
C3 root at the foraminal entry zone but no nerve root compression is
seen.

No change in left foraminal narrowing at C6-7 due to uncovertebral
disease.

Status post C3-5 fusion. The central canal and foramina are open at
the fusion levels. No complicating feature.

## 2019-10-29 DIAGNOSIS — R221 Localized swelling, mass and lump, neck: Secondary | ICD-10-CM | POA: Diagnosis not present

## 2019-10-29 DIAGNOSIS — E785 Hyperlipidemia, unspecified: Secondary | ICD-10-CM | POA: Diagnosis not present

## 2019-11-06 DIAGNOSIS — Z6825 Body mass index (BMI) 25.0-25.9, adult: Secondary | ICD-10-CM | POA: Diagnosis not present

## 2019-11-06 DIAGNOSIS — R221 Localized swelling, mass and lump, neck: Secondary | ICD-10-CM | POA: Diagnosis not present

## 2019-12-02 DIAGNOSIS — H26493 Other secondary cataract, bilateral: Secondary | ICD-10-CM | POA: Diagnosis not present

## 2019-12-02 DIAGNOSIS — R7303 Prediabetes: Secondary | ICD-10-CM | POA: Diagnosis not present

## 2019-12-02 DIAGNOSIS — Z961 Presence of intraocular lens: Secondary | ICD-10-CM | POA: Diagnosis not present

## 2019-12-02 DIAGNOSIS — H35361 Drusen (degenerative) of macula, right eye: Secondary | ICD-10-CM | POA: Diagnosis not present

## 2021-04-29 ENCOUNTER — Encounter: Payer: Self-pay | Admitting: Gastroenterology
